# Patient Record
Sex: Female | Born: 1970 | Race: Black or African American | Hispanic: No | Smoking: Never smoker
Health system: Southern US, Community
[De-identification: ages and names within clinical notes are randomized; demographics above are authoritative.]

## PROBLEM LIST (undated history)

## (undated) DIAGNOSIS — R51 Headache: Secondary | ICD-10-CM

## (undated) DIAGNOSIS — R519 Headache, unspecified: Secondary | ICD-10-CM

---

## 1989-12-03 HISTORY — PX: KNEE ARTHROSCOPY: SHX127

## 2000-03-27 ENCOUNTER — Other Ambulatory Visit: Admission: RE | Admit: 2000-03-27 | Discharge: 2000-03-27 | Payer: Self-pay | Admitting: Obstetrics & Gynecology

## 2002-06-17 ENCOUNTER — Other Ambulatory Visit: Admission: RE | Admit: 2002-06-17 | Discharge: 2002-06-17 | Payer: Self-pay | Admitting: Obstetrics & Gynecology

## 2003-06-26 ENCOUNTER — Other Ambulatory Visit: Admission: RE | Admit: 2003-06-26 | Discharge: 2003-06-26 | Payer: Self-pay | Admitting: Obstetrics & Gynecology

## 2004-06-28 ENCOUNTER — Other Ambulatory Visit: Admission: RE | Admit: 2004-06-28 | Discharge: 2004-06-28 | Payer: Self-pay | Admitting: Obstetrics & Gynecology

## 2006-01-17 ENCOUNTER — Emergency Department (HOSPITAL_COMMUNITY): Admission: EM | Admit: 2006-01-17 | Discharge: 2006-01-17 | Payer: Self-pay | Admitting: Emergency Medicine

## 2010-04-27 ENCOUNTER — Other Ambulatory Visit: Payer: Self-pay | Admitting: Obstetrics & Gynecology

## 2010-04-27 ENCOUNTER — Inpatient Hospital Stay (HOSPITAL_COMMUNITY)
Admission: AD | Admit: 2010-04-27 | Discharge: 2010-04-30 | DRG: 765 | Disposition: A | Discharge status: Home / Self Care | Payer: Managed Care, Other (non HMO) | Source: Ambulatory Visit | Attending: Obstetrics & Gynecology | Admitting: Obstetrics & Gynecology

## 2010-04-27 DIAGNOSIS — Z302 Encounter for sterilization: Secondary | ICD-10-CM

## 2010-04-27 DIAGNOSIS — O34219 Maternal care for unspecified type scar from previous cesarean delivery: Principal | ICD-10-CM | POA: Diagnosis present

## 2010-04-27 DIAGNOSIS — O09529 Supervision of elderly multigravida, unspecified trimester: Secondary | ICD-10-CM | POA: Diagnosis present

## 2010-04-27 LAB — CBC
HCT: 35.9 % — ABNORMAL LOW (ref 36.0–46.0)
Hemoglobin: 12.3 g/dL (ref 12.0–15.0)
MCHC: 34.3 g/dL (ref 30.0–36.0)
RBC: 3.97 MIL/uL (ref 3.87–5.11)
WBC: 8.1 10*3/uL (ref 4.0–10.5)

## 2010-04-27 LAB — RPR: RPR Ser Ql: NONREACTIVE

## 2010-04-27 NOTE — Op Note (Signed)
NAME:  Mikayla Wiley, Mikayla Wiley               ACCOUNT NO.:  [id number]  MEDICAL RECORD NO.:  [id number]           PATIENT TYPE:  I  LOCATION:  9119                          FACILITY:  [facility]  PHYSICIAN:  Gerrit Friends. Aldona Bar, M.D.   DATE OF BIRTH:  [birth year]  DATE OF PROCEDURE:  04/26/2010 DATE OF DISCHARGE:                              OPERATIVE REPORT   PREOPERATIVE DIAGNOSES: 1. 34-week intrauterine pregnancy. 2. Previous cesarean section. 3. Desire for permanent elective sterilization. 4. Active labor. 5. Nonreassuring fetal tracing. 6. Unknown GBS Status  POSTOPERATIVE DIAGNOSES: 1. 34-week intrauterine pregnancy. 2. Previous cesarean section. 3. Desire for permanent elective sterilization. 4. Active labor. 5. Nonreassuring fetal tracing. 6. Unknown GBS Status 7. Delivery of 1964 grams female infant, Apgars 2 and 8, arterial cord     pH 7.25, and pathology pending on segments of each fallopian tube.  PROCEDURES:  Repeat low transverse cesarean section and tubal sterilization procedure.  SURGEON:  Gerrit Friends. Aldona Bar, MD  ANESTHESIA:  Attempted spinal, general endotracheal anesthesia - Dr. Malen Gauze.  HISTORY:  This 40 year old gravida 3, para 1 (previous cesarean section) presented to Triage having been brought in by the Rescue Squad in active labor at 34 weeks' gestation.  The patient clocked in at [facility] at 12:51 a.m.  I was called at 1:10 a.m. and told of the patient's arrival and that not only was she a repeat cesarean section in labor but there was a nonreassuring fetal tracing and that the cervical exam found the patient be fully dilated with the vertex at +1 station. I arrived at the hospital in less than 10 minutes and assessed the patient.  At this time, the fetal heart appeared to be somewhat reassuring.  A vaginal exam found the patient to be 4 cm dilated about 90% effaced with vertex at about zero to +1 station.  Status of membranes was unknown - no membranes were  felt.  In discussion with the patient, I confirmed her desire for a repeat cesarean section and indeed this was my choice because of the nonreassuring fetal heart pattern that was documented prior to my arrival and the fact that the patient was only 4 cm dilated by my exam. The patient also had this discussed with me earlier in the office a permanent elective sterilization procedure.  Accordingly, the permit was signed by the patient for a repeat cesarean section and a tubal sterilization procedure but because of all the subsequent excitement, I did not signed the permit itself until after the procedure was complete. However, I had talked to the patient about this in the MAU area.  DESCRIPTION OF PROCEDURE:  The patient was wheeled to the OR which was ready and was taken into the operating room.  Because of the somewhat reassuring fetal heart tracing, a spinal anesthetic was carried out by Dr. Malen Gauze and once the patient thereafter was prepped and draped with a Foley catheter placed, the spinal was found to be not working - there was essentially no anesthetic level and therefore general endotracheal anesthesia was carried out.  Once the patient was anesthetized, procedure was begun.  A  Pfannenstiel incision was made and with minimal difficulty dissected down sharply to and through the fascia in a low transverse fashion with hemostasis created at each layer as best as possible.  Subfascial space was created inferiorly and superiorly, muscles separated in midline, peritoneum identified and entered appropriately with care taken to avoid the bowel superiorly and the bladder inferiorly.  There were some significant adhesions from the anterior abdominal wall to the anterior surface of the uterus and these were sharply taken down.  At this time, the vesicouterine peritoneum was incised in a low transverse fashion.  Ultimately, the cavity was entered and excision extended laterally.  The  baby was in vertex and head was deeply fixed in the pelvis.  An attempt was made to elevate the head out of the pelvis, but it was felt to be ultimately easier to extend the incision laterally giving the more room and to go to the fundus of the uterus and deliver the baby as a breech with the head gently lifted out of the pelvis after delivery of the baby's body.  This was carried out and went very well.  Infant was delivered essentially as a breech and once the cord was clamped and cut, the infant was passed off to the awaiting team.  Infant's Apgars were 2 and 8, and arterial cord pH which was sent was 7.25.  There was a very tight nuchal cord noted.  At this time, the placenta was delivered intact after appropriate cord bloods were collected and the placenta was sent to pathology appropriately labeled.  At this time, the uterus was delivered.  As previously mentioned, there was an extension of the uterine incision laterally, the uterine incision was closed using #1 Vicryl in a running locking fashion and this was oversewn with #1 Vicryl running in a fashion and this was oversewn with several figure-of-eight #1 Vicryls with ultimate adequate hemostasis achieved.  At this time, the uterus was noted to be fairly well contracted and bleeding was controlled in the uterine incision.  Attention was turned to each fallopian tube.  A classic Pomeroy tubal sterilization procedure was carried out - a knuckle tube was elevated and a free tie of #1 plain catgut suture tied down about the knuckle.  The knuckle was excised and sent to pathology and this was carried out on both the right and left fallopian tubes.  Specimens were sent appropriately labeled.  In the process, on the left fallopian tube, some adhesions were freed up to enhance mobility of the tube.  At this time, again the uterine incision was noted to be relatively dry. The abdomen lavaged of all free blood and clot and at this point  all counts were noted to be correct and no foreign bodies were noted to be remaining in the abdominal cavity.  The uterus was replaced into the abdominal cavity.  Again, the uterine incision was relatively dry and the tubal sterilization sites were dry as well.  At this time, closure of the abdomen was begun in layers.  The abdominal peritoneum was closed with 0-Vicryl in a running fashion with care taken to avoid bowel and bladder.  The muscles were then closed as well with this continuous 0- Vicryl suture.  Subfascial space was noted to be relatively dry.  At this time, the fascia was closed using 0-Vicryl from angle to midline bilaterally.  Subcutaneous tissue was noted to be hemostatic and staples were then used to close the skin.  A sterile pressure dressing was  applied and the patient was transported to recovery in satisfactory condition having tolerated the procedure well.  Estimated blood loss 600 mL.  All counts correct x2.  The baby was seen in the Newborn Intensive Care Unit and was stable. The baby was on no oxygen and appeared to have tolerated the procedure well.  Likewise, upon mother's arrival in recovery room, she was stable as well, recovering from both surgery and general anesthetic and failed spinal anesthetic.  In summary, this patient, a 40 year old gravida 3, para 1 who had a previous cesarean section presented to Maternity Admissions in active labor with a nonreassuring tracing.  She was taken to the operating room for a repeat low transverse cesarean section at 34 weeks and was delivered of this 1964 grams female infant with Apgars of 2 and 8. At conclusion of the procedure, both mother and baby were doing well in their respective recovery areas.  As mentioned, all counts were correct x2 and pathologic specimen consisted of the segment of each fallopian tube and placenta.     Gerrit Friends. Aldona Bar, M.D.     RMW/MEDQ  D:  04/27/2010  T:  04/27/2010  Job:   [id number]  Electronically Signed by Annamaria Helling M.D. on 04/27/2010 07:14:06 AM

## 2010-04-28 LAB — CBC
HCT: 28.8 % — ABNORMAL LOW (ref 36.0–46.0)
Hemoglobin: 9.7 g/dL — ABNORMAL LOW (ref 12.0–15.0)
MCV: 91.7 fL (ref 78.0–100.0)
RBC: 3.14 MIL/uL — ABNORMAL LOW (ref 3.87–5.11)
WBC: 16.6 10*3/uL — ABNORMAL HIGH (ref 4.0–10.5)

## 2010-06-03 NOTE — Discharge Summary (Signed)
  NAME:  DESAREE, DOWNEN               ACCOUNT NO.:  [id number]  MEDICAL RECORD NO.:  [id number]           PATIENT TYPE:  I  LOCATION:  9119                          [facility]  [facility]  PHYSICIAN:  Ari Bernabei H. Tenny Craw, MD     DATE OF BIRTH:  1970-10-27  DATE OF ADMISSION:  04/27/2010 DATE OF DISCHARGE:  04/30/2010                              DISCHARGE SUMMARY   DISCHARGING PHYSICIAN:  Enrique Sack H. Tenny Craw, MD  HOSPITAL PROCEDURES: 1. Repeat low transverse cesarean section. 2. Bilateral tubal ligation.  HOSPITAL COURSE:  Ms. Laural Benes is a 40 year old gravida 3, para 1-0-1-1, who presented in the early morning of April 26, 2010, in active labor by squad.  The patient was 34 weeks' gestation.  There was nonreassuring fetal well-being noted on electronic fetal monitoring.  The decision was made to proceed directly to the operating room for an emergent repeat low transverse cesarean section.  The patient also desired permanent sterilization.  She underwent a repeat low transverse cesarean section by Dr. Annamaria Helling for delivery of a female infant, weighing 1964 g with Apgar scores of 2 at 1 minute and 8 at 5 minutes and an arterial cord pH of 7.25.  Additionally during this procedure, she underwent a bilateral tubal ligation.  Postoperatively, she did well and by postoperative day #4, she was deemed ready for discharge home.  She had excellent pain control with oral pain medication.  She was instructed of any signs or symptoms to be aware for need to return to the hospital.  Staples were removed prior to discharge.  At this time, the baby remained in the NICU, doing well.  DISCHARGE LABS:  White blood cell count 16.7, hemoglobin 9.7, hematocrit 28.8, platelets 120.     Freddrick March. Tenny Craw, MD     KHR/MEDQ  D:  04/30/2010  T:  04/30/2010  Job:  [id number]  Electronically Signed by Waynard Reeds MD on 06/03/2010 09:04:42 AM

## 2011-05-20 ENCOUNTER — Other Ambulatory Visit: Payer: Self-pay | Admitting: Family Medicine

## 2011-05-20 DIAGNOSIS — IMO0002 Reserved for concepts with insufficient information to code with codable children: Secondary | ICD-10-CM

## 2011-05-25 ENCOUNTER — Ambulatory Visit
Admission: RE | Admit: 2011-05-25 | Discharge: 2011-05-25 | Disposition: A | Discharge status: Home / Self Care | Payer: BC Managed Care – PPO | Source: Ambulatory Visit | Attending: Family Medicine | Admitting: Family Medicine

## 2011-05-25 DIAGNOSIS — IMO0002 Reserved for concepts with insufficient information to code with codable children: Secondary | ICD-10-CM

## 2012-08-25 ENCOUNTER — Encounter (HOSPITAL_COMMUNITY): Payer: Self-pay | Admitting: Emergency Medicine

## 2012-08-25 ENCOUNTER — Emergency Department (INDEPENDENT_AMBULATORY_CARE_PROVIDER_SITE_OTHER)
Admission: EM | Admit: 2012-08-25 | Discharge: 2012-08-25 | Disposition: A | Discharge status: Home / Self Care | Payer: BC Managed Care – PPO | Source: Home / Self Care

## 2012-08-25 DIAGNOSIS — M5412 Radiculopathy, cervical region: Secondary | ICD-10-CM

## 2012-08-25 MED ORDER — AMITRIPTYLINE HCL 25 MG PO TABS: 25.0000 mg | ORAL_TABLET | Freq: Every evening | ORAL | Status: DC | PRN

## 2012-08-25 NOTE — ED Provider Notes (Signed)
Mikayla Wiley is a 42 y.o. female who presents to Urgent Care today for right arm numbness tingling and weakness. Patient has had intermittent but worsening right arm tingling numbness associated with some weakness. The distribution is predominantly from her lateral elbow into the first 3 digits of her right hand. This is associated with some weakness as well. She notes that it is slightly worsening. She denies any injury. Additionally she notes right lateral hip pain with radiating pain to her lateral knee. This is painful to lay on that side and worse with activity. She's tried some over-the-counter pain medications which have helped some. She denies any injury nausea vomiting diarrhea fevers or chills.    PMH reviewed. History of cervical DDD C5 and C6 History  Substance Use Topics  . Smoking status: Never Smoker   . Smokeless tobacco: Not on file  . Alcohol Use: No   ROS as above Medications reviewed. No current facility-administered medications for this encounter.   Current Outpatient Prescriptions  Medication Sig Dispense Refill  . amitriptyline (ELAVIL) 25 MG tablet Take 1 tablet (25 mg total) by mouth at bedtime as needed for pain.  30 tablet  0    Exam:  Pulse 66  Temp(Src) 97.9 F (36.6 C) (Oral)  Resp 17  SpO2 100%  LMP 08/07/2012 Gen: Well NAD HEENT: EOMI,  MMM Exts: Non edematous BL  LE, warm and well perfused.  MSK: Neck: Nontender spinal midline. Decreased range of motion the lateral flexion. Mildly positive Spurling's test.  Upper extremity grip strength: Diminished on right compared to left.  Positive Phalen's test right compared to left.  Negative Tinel's test.  Hip: Tender to palpation over right greater trochanter. Normal range of motion.  Normal gait Capillary refill and sensation are intact bilateral upper and lower extremity  No results found for this or any previous visit (from the past 24 hour(s)). No results found.  Assessment and Plan: 42 y.o.  female with  1) paresthesias and loss of grip strength in the right upper extremity. Most likely cause of cervical radiculopathy as patient has an MRI from 2013 showing DDD at C5 and C6. Alternative diagnoses include carpal tunnel syndrome.  Patient has bothersome paresthesias. Will use amitriptyline at night for symptomatic relief.  Additional followup with Dr. Farris Has her Dr. Katrinka Blazing in several days. Recommend repeat MRI scan at that time plus/minus nerve conduction studies.  2) additionally patient has greater trochanteric bursitis of the right.  Followup the sports medicine as above. Consider injection therapy bedtime as needed.    Discussed warning signs or symptoms. Please see discharge instructions. Patient expresses understanding.      Rodolph Bong, MD 08/25/12 1019

## 2012-08-25 NOTE — ED Notes (Signed)
Pt c/o intermittent numbness on right side... Right arm and right leg Last past week, has been more frequently Denies: weakness, CP, SOB, blurry vision Alert w/no signs of acute distress.

## 2013-09-19 ENCOUNTER — Other Ambulatory Visit (HOSPITAL_COMMUNITY)
Admission: RE | Admit: 2013-09-19 | Discharge: 2013-09-19 | Disposition: A | Discharge status: Home / Self Care | Payer: BC Managed Care – PPO | Source: Ambulatory Visit | Attending: Physician Assistant | Admitting: Physician Assistant

## 2013-09-19 ENCOUNTER — Other Ambulatory Visit: Payer: Self-pay | Admitting: Physician Assistant

## 2013-09-19 DIAGNOSIS — Z1151 Encounter for screening for human papillomavirus (HPV): Secondary | ICD-10-CM | POA: Diagnosis present

## 2013-09-19 DIAGNOSIS — Z124 Encounter for screening for malignant neoplasm of cervix: Secondary | ICD-10-CM | POA: Diagnosis not present

## 2013-09-20 ENCOUNTER — Other Ambulatory Visit: Payer: Self-pay

## 2013-09-20 DIAGNOSIS — Z1231 Encounter for screening mammogram for malignant neoplasm of breast: Secondary | ICD-10-CM

## 2013-09-23 ENCOUNTER — Ambulatory Visit: Payer: BC Managed Care – PPO

## 2013-09-24 ENCOUNTER — Other Ambulatory Visit: Payer: Self-pay | Admitting: Orthopedic Surgery

## 2013-09-24 DIAGNOSIS — M542 Cervicalgia: Secondary | ICD-10-CM

## 2013-09-24 LAB — CYTOLOGY - PAP

## 2013-09-26 ENCOUNTER — Ambulatory Visit
Admission: RE | Admit: 2013-09-26 | Discharge: 2013-09-26 | Disposition: A | Discharge status: Home / Self Care | Payer: BC Managed Care – PPO | Source: Ambulatory Visit

## 2013-09-26 ENCOUNTER — Encounter (INDEPENDENT_AMBULATORY_CARE_PROVIDER_SITE_OTHER): Payer: Self-pay

## 2013-09-26 DIAGNOSIS — Z1231 Encounter for screening mammogram for malignant neoplasm of breast: Secondary | ICD-10-CM

## 2013-10-03 ENCOUNTER — Ambulatory Visit
Admission: RE | Admit: 2013-10-03 | Discharge: 2013-10-03 | Disposition: A | Discharge status: Home / Self Care | Payer: BC Managed Care – PPO | Source: Ambulatory Visit | Attending: Orthopedic Surgery | Admitting: Orthopedic Surgery

## 2013-10-03 DIAGNOSIS — M542 Cervicalgia: Secondary | ICD-10-CM

## 2013-10-04 ENCOUNTER — Other Ambulatory Visit: Payer: BC Managed Care – PPO

## 2013-12-17 ENCOUNTER — Other Ambulatory Visit (HOSPITAL_COMMUNITY): Payer: Self-pay | Admitting: Orthopaedic Surgery

## 2013-12-31 ENCOUNTER — Other Ambulatory Visit (HOSPITAL_COMMUNITY): Payer: Self-pay | Admitting: Orthopaedic Surgery

## 2014-01-20 ENCOUNTER — Encounter (HOSPITAL_COMMUNITY): Payer: Self-pay | Admitting: Pharmacy Technician

## 2014-01-21 ENCOUNTER — Encounter (HOSPITAL_COMMUNITY): Payer: Self-pay

## 2014-01-21 ENCOUNTER — Ambulatory Visit (HOSPITAL_COMMUNITY)
Admission: RE | Admit: 2014-01-21 | Discharge: 2014-01-21 | Disposition: A | Discharge status: Home / Self Care | Payer: BLUE CROSS/BLUE SHIELD | Source: Ambulatory Visit | Attending: Orthopaedic Surgery | Admitting: Orthopaedic Surgery

## 2014-01-21 ENCOUNTER — Encounter (HOSPITAL_COMMUNITY)
Admission: RE | Admit: 2014-01-21 | Discharge: 2014-01-21 | Disposition: A | Discharge status: Home / Self Care | Payer: BLUE CROSS/BLUE SHIELD | Source: Ambulatory Visit | Attending: Orthopaedic Surgery | Admitting: Orthopaedic Surgery

## 2014-01-21 DIAGNOSIS — R51 Headache: Secondary | ICD-10-CM | POA: Insufficient documentation

## 2014-01-21 DIAGNOSIS — Z01818 Encounter for other preprocedural examination: Secondary | ICD-10-CM | POA: Insufficient documentation

## 2014-01-21 DIAGNOSIS — M4722 Other spondylosis with radiculopathy, cervical region: Secondary | ICD-10-CM

## 2014-01-21 HISTORY — DX: Headache: R51

## 2014-01-21 HISTORY — DX: Headache, unspecified: R51.9

## 2014-01-21 LAB — URINALYSIS, ROUTINE W REFLEX MICROSCOPIC
Bilirubin Urine: NEGATIVE
Glucose, UA: NEGATIVE mg/dL
HGB URINE DIPSTICK: NEGATIVE
KETONES UR: NEGATIVE mg/dL
NITRITE: NEGATIVE
PH: 5 (ref 5.0–8.0)
Protein, ur: NEGATIVE mg/dL
SPECIFIC GRAVITY, URINE: 1.026 (ref 1.005–1.030)
Urobilinogen, UA: 0.2 mg/dL (ref 0.0–1.0)

## 2014-01-21 LAB — CBC
HEMATOCRIT: 39 % (ref 36.0–46.0)
HEMOGLOBIN: 13.3 g/dL (ref 12.0–15.0)
MCH: 30.8 pg (ref 26.0–34.0)
MCHC: 34.1 g/dL (ref 30.0–36.0)
MCV: 90.3 fL (ref 78.0–100.0)
Platelets: 203 10*3/uL (ref 150–400)
RBC: 4.32 MIL/uL (ref 3.87–5.11)
RDW: 12.7 % (ref 11.5–15.5)
WBC: 4.8 10*3/uL (ref 4.0–10.5)

## 2014-01-21 LAB — PROTIME-INR
INR: 1.13 (ref 0.00–1.49)
Prothrombin Time: 14.6 seconds (ref 11.6–15.2)

## 2014-01-21 LAB — COMPREHENSIVE METABOLIC PANEL
ALBUMIN: 3.8 g/dL (ref 3.5–5.2)
ALT: 9 U/L (ref 0–35)
ANION GAP: 3 — AB (ref 5–15)
AST: 18 U/L (ref 0–37)
Alkaline Phosphatase: 55 U/L (ref 39–117)
BUN: 11 mg/dL (ref 6–23)
CO2: 29 mmol/L (ref 19–32)
CREATININE: 1.17 mg/dL — AB (ref 0.50–1.10)
Calcium: 9.3 mg/dL (ref 8.4–10.5)
Chloride: 109 mEq/L (ref 96–112)
GFR calc Af Amer: 64 mL/min — ABNORMAL LOW (ref >90)
GFR calc non Af Amer: 55 mL/min — ABNORMAL LOW (ref >90)
GLUCOSE: 88 mg/dL (ref 70–99)
Potassium: 4.3 mmol/L (ref 3.5–5.1)
SODIUM: 141 mmol/L (ref 135–145)
Total Bilirubin: 1.1 mg/dL (ref 0.3–1.2)
Total Protein: 7 g/dL (ref 6.0–8.3)

## 2014-01-21 LAB — URINE MICROSCOPIC-ADD ON

## 2014-01-21 LAB — SURGICAL PCR SCREEN
MRSA, PCR: NEGATIVE
Staphylococcus aureus: NEGATIVE

## 2014-01-21 LAB — HCG, SERUM, QUALITATIVE: Preg, Serum: NEGATIVE

## 2014-01-21 NOTE — Pre-Procedure Instructions (Signed)
Jeris Pentanita W Johnson  01/21/2014   Your procedure is scheduled on: Friday, January 31, 2014  [facility] at 5:30 AM.  Call this number if you have problems the morning of surgery: [phone]   Remember:   Do not eat food or drink liquids after midnight Thursday, January 30, 2014   Take these medicines the morning of surgery with A SIP OF WATER: None  Stop taking Aspirin, vitamins, and herbal medications. Do not take any NSAIDs ie: Ibuprofen, Advil, Naproxen or any medication containing Aspirin; stop 1 week prior to procedure ( Friday, January 24, 2014)  Do not wear jewelry, make-up or nail polish.  Do not wear lotions, powders, or perfumes. You may not wear deodorant.  Do not shave 48 hours prior to surgery.   Do not bring valuables to the hospital.  [facility] is not responsible for any belongings or valuables.               Contacts, dentures or bridgework may not be worn into surgery.  Leave suitcase in the car. After surgery it may be brought to your room.  For patients admitted to the hospital, discharge time is determined by your treatment team.               Patients discharged the day of surgery will not be allowed to drive home.  Name and phone number of your driver:   Special Instructions:  Special Instructions:Special Instructions: [facility] - Preparing for Surgery  Before surgery, you can play an important role.  Because skin is not sterile, your skin needs to be as free of germs as possible.  You can reduce the number of germs on you skin by washing with CHG (chlorahexidine gluconate) soap before surgery.  CHG is an antiseptic cleaner which kills germs and bonds with the skin to continue killing germs even after washing.  Please DO NOT use if you have an allergy to CHG or antibacterial soaps.  If your skin becomes reddened/irritated stop using the CHG and inform your nurse when you arrive at Short Stay.  Do not shave (including legs and  underarms) for at least 48 hours prior to the first CHG shower.  You may shave your face.  Please follow these instructions carefully:   1.  Shower with CHG Soap the night before surgery and the morning of Surgery.  2.  If you choose to wash your hair, wash your hair first as usual with your normal shampoo.  3.  After you shampoo, rinse your hair and body thoroughly to remove the Shampoo.  4.  Use CHG as you would any other liquid soap.  You can apply chg directly  to the skin and wash gently with scrungie or a clean washcloth.  5.  Apply the CHG Soap to your body ONLY FROM THE NECK DOWN.  Do not use on open wounds or open sores.  Avoid contact with your eyes, ears, mouth and genitals (private parts).  Wash genitals (private parts) with your normal soap.  6.  Wash thoroughly, paying special attention to the area where your surgery will be performed.  7.  Thoroughly rinse your body with warm water from the neck down.  8.  DO NOT shower/wash with your normal soap after using and rinsing off the CHG Soap.  9.  Pat yourself dry with a clean towel.            10.  Wear clean pajamas.  11.  Place clean sheets on your bed the night of your first shower and do not sleep with pets.  Day of Surgery  Do not apply any lotions/deodorants the morning of surgery.  Please wear clean clothes to the hospital/surgery center.   Please read over the following fact sheets that you were given: Pain Booklet, Coughing and Deep Breathing, MRSA Information and Surgical Site Infection Prevention

## 2014-01-21 NOTE — Progress Notes (Signed)
PCP: [facility]: Dr. Ihor DowNnodi or Dr. Leary RocaMahoney  Denies any cardiac studies.

## 2014-01-28 NOTE — H&P (Signed)
  [facility]   A Division of [facility]   [address], [place], [place] [address] Telephone: [phone]  Fax: [phone]     PATIENT: Mikayla Wiley, Mikayla Wiley   MR#: [id number]  DOB: 07/16/1970   Visit Date: 10/22/2013     Patient is seen for cervical spondylosis with left radiculopathy from C5-6 soft and hard osteophyte complex with compression.  She has been treated with physical therapy, muscle relaxants, Medrol Dosepak without relief.  She has had symptoms dating back to 2014 with progressive symptoms over the last 1-1/2 years.  MRI scan dating back 2013 showed cervical disk at C5-6 with left-sided compression.  She has been on Elavil to help at night with sleeping.  Followup MRI scan performed 10/03/2013 showed worsening and progression of the left C5-6 left-side disk herniation with further progression and effacement of the ventral CSF with moderate left foraminal stenosis and mild right foraminal stenosis.  Minimal bulge at C4-5, other levels were normal.  She has used anti-inflammatories, Tylenol, Medrol Dosepak, muscle relaxants, amitriptyline, without relief.     [facility]   PAST MEDICAL/SURGICAL HISTORY:  Previous surgeries include arthroscopic surgery in 1991, C-section 1999 and 2012.   SOCIAL HISTORY:  Patient is married to her husband, Helvin.  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  She does not smoke.  Occasionally drinks socially.     FAMILY HISTORY:  Positive for diabetes, hypertension.   REVIEW OF SYSTEMS:  A 14-point is positive for migraines.     PHYSICAL EXAMINATION:  Patient is alert and oriented, 5 feet 7 inches, 150 pounds, extraocular movement is intact.  Normal smile and grimace.  She has brachial plexus tenderness on the left, positive Spurling on the left, minimal tenderness on the right, negative Spurling on the right, negative Lhermitte.  Left radial side had numbness.   Normal sensation on the right.  Isolated motor testing is intact.  Reflexes are 2+ and symmetrical.  She has severe pain with cervical compression, minimal relief with distraction.     ASSESSMENT:  Progressive spondylosis, disk progression left C5-6 with radiculopathy and failure of conservative treatment over 2-years.   PLAN:  We discussed options.  She would like to proceed with cervical intervention.  We discussed operative technique, overnight stay, use of the operative microscope, incision, risks of surgery including dysphonia, dysphasia, pseudarthrosis, reoperation, spinal cord problems, recurrent laryngeal nerve problems, and need for posterior cervical fusion if anterior procedure did not heal.  Card is given, she would like to schedule this after the holidays due to her job.      For additional information please see handwritten notes, reports, orders and prescriptions in this chart.      Mark C. Ophelia CharterYates, M.D.    Auto-Authenticated by Veverly FellsMark C. Ophelia CharterYates, M.D.  MCY/lp/af DD: 10/22/2013  DT: 10/23/2013   cc: Duane LopeAlan Ross, M.D.([employer])

## 2014-01-30 MED ORDER — CEFAZOLIN SODIUM-DEXTROSE 2-3 GM-% IV SOLR
2.0000 g | INTRAVENOUS | Status: AC
  Administered 2014-01-31: 2 g via INTRAVENOUS
  Filled 2014-01-30: qty 50

## 2014-01-31 ENCOUNTER — Ambulatory Visit (HOSPITAL_COMMUNITY): Payer: BLUE CROSS/BLUE SHIELD

## 2014-01-31 ENCOUNTER — Ambulatory Visit (HOSPITAL_COMMUNITY): Payer: BLUE CROSS/BLUE SHIELD | Admitting: Anesthesiology

## 2014-01-31 ENCOUNTER — Encounter (HOSPITAL_COMMUNITY)
Admission: RE | Disposition: A | Discharge status: Home / Self Care | Payer: Self-pay | Source: Ambulatory Visit | Attending: Orthopaedic Surgery

## 2014-01-31 ENCOUNTER — Encounter (HOSPITAL_COMMUNITY): Payer: Self-pay | Admitting: *Deleted

## 2014-01-31 ENCOUNTER — Observation Stay (HOSPITAL_COMMUNITY)
Admission: RE | Admit: 2014-01-31 | Discharge: 2014-02-01 | Disposition: A | Discharge status: Home / Self Care | Payer: BLUE CROSS/BLUE SHIELD | Source: Ambulatory Visit | Attending: Orthopaedic Surgery | Admitting: Orthopaedic Surgery

## 2014-01-31 DIAGNOSIS — M4722 Other spondylosis with radiculopathy, cervical region: Principal | ICD-10-CM | POA: Insufficient documentation

## 2014-01-31 DIAGNOSIS — Z981 Arthrodesis status: Secondary | ICD-10-CM

## 2014-01-31 DIAGNOSIS — Z419 Encounter for procedure for purposes other than remedying health state, unspecified: Secondary | ICD-10-CM

## 2014-01-31 HISTORY — PX: ANTERIOR CERVICAL DECOMP/DISCECTOMY FUSION: SHX1161

## 2014-01-31 SURGERY — ANTERIOR CERVICAL DECOMPRESSION/DISCECTOMY FUSION 1 LEVEL
Anesthesia: General

## 2014-01-31 MED ORDER — CHLORHEXIDINE GLUCONATE 4 % EX LIQD
60.0000 mL | Freq: Once | CUTANEOUS | Status: DC
  Filled 2014-01-31: qty 60

## 2014-01-31 MED ORDER — METOCLOPRAMIDE HCL 5 MG/ML IJ SOLN: 5.0000 mg | Freq: Three times a day (TID) | INTRAMUSCULAR | Status: DC | PRN

## 2014-01-31 MED ORDER — HYDROMORPHONE HCL 1 MG/ML IJ SOLN
0.2500 mg | INTRAMUSCULAR | Status: DC | PRN
  Administered 2014-01-31 (×2): 0.5 mg via INTRAVENOUS

## 2014-01-31 MED ORDER — NEOSTIGMINE METHYLSULFATE 10 MG/10ML IV SOLN
INTRAVENOUS | Status: DC | PRN
  Administered 2014-01-31: 4 mg via INTRAVENOUS

## 2014-01-31 MED ORDER — METHOCARBAMOL 500 MG PO TABS
500.0000 mg | ORAL_TABLET | Freq: Four times a day (QID) | ORAL | Status: DC | PRN
  Administered 2014-01-31 – 2014-02-01 (×3): 500 mg via ORAL
  Filled 2014-01-31 (×3): qty 1

## 2014-01-31 MED ORDER — ONDANSETRON HCL 4 MG/2ML IJ SOLN
4.0000 mg | Freq: Four times a day (QID) | INTRAMUSCULAR | Status: DC | PRN
  Administered 2014-01-31 – 2014-02-01 (×2): 4 mg via INTRAVENOUS
  Filled 2014-01-31 (×2): qty 2

## 2014-01-31 MED ORDER — PHENYLEPHRINE 40 MCG/ML (10ML) SYRINGE FOR IV PUSH (FOR BLOOD PRESSURE SUPPORT)
PREFILLED_SYRINGE | INTRAVENOUS | Status: AC
  Filled 2014-01-31: qty 10

## 2014-01-31 MED ORDER — DEXAMETHASONE SODIUM PHOSPHATE 10 MG/ML IJ SOLN
INTRAMUSCULAR | Status: DC | PRN
  Administered 2014-01-31: 10 mg via INTRAVENOUS

## 2014-01-31 MED ORDER — SUCCINYLCHOLINE CHLORIDE 20 MG/ML IJ SOLN
INTRAMUSCULAR | Status: AC
  Filled 2014-01-31: qty 1

## 2014-01-31 MED ORDER — PROPOFOL 10 MG/ML IV BOLUS
INTRAVENOUS | Status: DC | PRN
  Administered 2014-01-31: 150 mg via INTRAVENOUS

## 2014-01-31 MED ORDER — ROCURONIUM BROMIDE 100 MG/10ML IV SOLN
INTRAVENOUS | Status: DC | PRN
  Administered 2014-01-31: 30 mg via INTRAVENOUS

## 2014-01-31 MED ORDER — HYDROMORPHONE HCL 1 MG/ML IJ SOLN
0.5000 mg | INTRAMUSCULAR | Status: DC | PRN
  Administered 2014-01-31: 1 mg via INTRAVENOUS
  Filled 2014-01-31 (×2): qty 1

## 2014-01-31 MED ORDER — SODIUM CHLORIDE 0.9 % IJ SOLN
INTRAMUSCULAR | Status: AC
  Filled 2014-01-31: qty 10

## 2014-01-31 MED ORDER — ONDANSETRON HCL 4 MG PO TABS: 4.0000 mg | ORAL_TABLET | Freq: Four times a day (QID) | ORAL | Status: DC | PRN

## 2014-01-31 MED ORDER — 0.9 % SODIUM CHLORIDE (POUR BTL) OPTIME
TOPICAL | Status: DC | PRN
  Administered 2014-01-31: 1000 mL

## 2014-01-31 MED ORDER — FENTANYL CITRATE 0.05 MG/ML IJ SOLN
INTRAMUSCULAR | Status: DC | PRN
  Administered 2014-01-31: 100 ug via INTRAVENOUS

## 2014-01-31 MED ORDER — ROCURONIUM BROMIDE 50 MG/5ML IV SOLN
INTRAVENOUS | Status: AC
  Filled 2014-01-31: qty 1

## 2014-01-31 MED ORDER — SUCCINYLCHOLINE CHLORIDE 20 MG/ML IJ SOLN
INTRAMUSCULAR | Status: DC | PRN
  Administered 2014-01-31: 120 mg via INTRAVENOUS

## 2014-01-31 MED ORDER — METHOCARBAMOL 1000 MG/10ML IJ SOLN
500.0000 mg | Freq: Four times a day (QID) | INTRAVENOUS | Status: DC | PRN
  Filled 2014-01-31: qty 5

## 2014-01-31 MED ORDER — OXYCODONE-ACETAMINOPHEN 10-325 MG PO TABS: 1.0000 | ORAL_TABLET | Freq: Four times a day (QID) | ORAL | Status: DC | PRN

## 2014-01-31 MED ORDER — PHENYLEPHRINE HCL 10 MG/ML IJ SOLN
10.0000 mg | INTRAVENOUS | Status: DC | PRN
  Administered 2014-01-31: 40 ug/min via INTRAVENOUS

## 2014-01-31 MED ORDER — AMITRIPTYLINE HCL 25 MG PO TABS
25.0000 mg | ORAL_TABLET | Freq: Every evening | ORAL | Status: DC | PRN
  Filled 2014-01-31: qty 1

## 2014-01-31 MED ORDER — PROMETHAZINE HCL 25 MG/ML IJ SOLN: 6.2500 mg | INTRAMUSCULAR | Status: DC | PRN

## 2014-01-31 MED ORDER — ONDANSETRON HCL 4 MG/2ML IJ SOLN
INTRAMUSCULAR | Status: AC
  Filled 2014-01-31: qty 2

## 2014-01-31 MED ORDER — LACTATED RINGERS IV SOLN
INTRAVENOUS | Status: DC | PRN
  Administered 2014-01-31: 07:00:00 via INTRAVENOUS

## 2014-01-31 MED ORDER — LIDOCAINE HCL (CARDIAC) 20 MG/ML IV SOLN
INTRAVENOUS | Status: AC
  Filled 2014-01-31: qty 5

## 2014-01-31 MED ORDER — METOCLOPRAMIDE HCL 10 MG PO TABS
5.0000 mg | ORAL_TABLET | Freq: Three times a day (TID) | ORAL | Status: DC | PRN
  Administered 2014-02-01: 10 mg via ORAL
  Filled 2014-01-31: qty 1

## 2014-01-31 MED ORDER — EPHEDRINE SULFATE 50 MG/ML IJ SOLN
INTRAMUSCULAR | Status: AC
  Filled 2014-01-31: qty 1

## 2014-01-31 MED ORDER — BUPIVACAINE-EPINEPHRINE (PF) 0.5% -1:200000 IJ SOLN
INTRAMUSCULAR | Status: AC
  Filled 2014-01-31: qty 30

## 2014-01-31 MED ORDER — HYDROMORPHONE HCL 1 MG/ML IJ SOLN
INTRAMUSCULAR | Status: AC
  Filled 2014-01-31: qty 1

## 2014-01-31 MED ORDER — FENTANYL CITRATE 0.05 MG/ML IJ SOLN
INTRAMUSCULAR | Status: AC
  Filled 2014-01-31: qty 5

## 2014-01-31 MED ORDER — GLYCOPYRROLATE 0.2 MG/ML IJ SOLN
INTRAMUSCULAR | Status: DC | PRN
  Administered 2014-01-31: 0.6 mg via INTRAVENOUS

## 2014-01-31 MED ORDER — METHOCARBAMOL 500 MG PO TABS: 500.0000 mg | ORAL_TABLET | Freq: Four times a day (QID) | ORAL | Status: DC

## 2014-01-31 MED ORDER — OXYCODONE-ACETAMINOPHEN 5-325 MG PO TABS
1.0000 | ORAL_TABLET | Freq: Four times a day (QID) | ORAL | Status: DC | PRN
  Administered 2014-01-31: 1 via ORAL
  Administered 2014-01-31 – 2014-02-01 (×2): 2 via ORAL
  Filled 2014-01-31: qty 1
  Filled 2014-01-31 (×2): qty 2

## 2014-01-31 MED ORDER — POTASSIUM CHLORIDE IN NACL 20-0.45 MEQ/L-% IV SOLN
INTRAVENOUS | Status: DC
  Administered 2014-01-31: 18:00:00 via INTRAVENOUS
  Filled 2014-01-31 (×3): qty 1000

## 2014-01-31 MED ORDER — LIDOCAINE HCL (CARDIAC) 20 MG/ML IV SOLN
INTRAVENOUS | Status: DC | PRN
  Administered 2014-01-31: 80 mg via INTRAVENOUS

## 2014-01-31 MED ORDER — ONDANSETRON 4 MG PO TBDP: 4.0000 mg | ORAL_TABLET | Freq: Three times a day (TID) | ORAL | Status: DC | PRN

## 2014-01-31 MED ORDER — MIDAZOLAM HCL 5 MG/5ML IJ SOLN
INTRAMUSCULAR | Status: DC | PRN
  Administered 2014-01-31: 2 mg via INTRAVENOUS

## 2014-01-31 MED ORDER — PROPOFOL 10 MG/ML IV BOLUS
INTRAVENOUS | Status: AC
  Filled 2014-01-31: qty 20

## 2014-01-31 MED ORDER — ONDANSETRON HCL 4 MG/2ML IJ SOLN
INTRAMUSCULAR | Status: DC | PRN
  Administered 2014-01-31: 4 mg via INTRAVENOUS

## 2014-01-31 MED ORDER — BUPIVACAINE-EPINEPHRINE 0.5% -1:200000 IJ SOLN
INTRAMUSCULAR | Status: DC | PRN
  Administered 2014-01-31: 20 mL

## 2014-01-31 MED ORDER — MIDAZOLAM HCL 2 MG/2ML IJ SOLN
INTRAMUSCULAR | Status: AC
  Filled 2014-01-31: qty 2

## 2014-01-31 SURGICAL SUPPLY — 59 items
ADH SKN CLS APL DERMABOND .7 (GAUZE/BANDAGES/DRESSINGS) ×1
BIT DRILL SKYLINE 12MM (BIT) ×1 IMPLANT
BLADE SURG ROTATE 9660 (MISCELLANEOUS) IMPLANT
BONE CERV LORDOTIC 14.5X12X6 (Bone Implant) ×3 IMPLANT
BUR ROUND FLUTED 4 SOFT TCH (BURR) ×2 IMPLANT
BUR ROUND FLUTED 4MM SOFT TCH (BURR) ×1
COLLAR CERV LO CONTOUR FIRM DE (SOFTGOODS) ×3 IMPLANT
CORDS BIPOLAR (ELECTRODE) IMPLANT
COVER MAYO STAND STRL (DRAPES) ×3 IMPLANT
COVER SURGICAL LIGHT HANDLE (MISCELLANEOUS) ×3 IMPLANT
CRADLE DONUT ADULT HEAD (MISCELLANEOUS) ×3 IMPLANT
DERMABOND ADVANCED (GAUZE/BANDAGES/DRESSINGS) ×2
DERMABOND ADVANCED .7 DNX12 (GAUZE/BANDAGES/DRESSINGS) ×1 IMPLANT
DRAPE C-ARM 42X72 X-RAY (DRAPES) ×3 IMPLANT
DRAPE MICROSCOPE LEICA (MISCELLANEOUS) ×3 IMPLANT
DRAPE PROXIMA HALF (DRAPES) ×3 IMPLANT
DRILL BIT SKYLINE 12MM (BIT) ×3
DRSG MEPILEX BORDER 4X4 (GAUZE/BANDAGES/DRESSINGS) ×3 IMPLANT
DRSG MEPILEX BORDER 4X8 (GAUZE/BANDAGES/DRESSINGS) ×3 IMPLANT
DURAPREP 6ML APPLICATOR 50/CS (WOUND CARE) ×3 IMPLANT
ELECT COATED BLADE 2.86 ST (ELECTRODE) ×3 IMPLANT
ELECT REM PT RETURN 9FT ADLT (ELECTROSURGICAL) ×3
ELECTRODE REM PT RTRN 9FT ADLT (ELECTROSURGICAL) ×1 IMPLANT
EVACUATOR 1/8 PVC DRAIN (DRAIN) ×3 IMPLANT
GAUZE SPONGE 4X4 12PLY STRL (GAUZE/BANDAGES/DRESSINGS) ×3 IMPLANT
GAUZE XEROFORM 1X8 LF (GAUZE/BANDAGES/DRESSINGS) ×6 IMPLANT
GLOVE BIO SURGEON STRL SZ 6 (GLOVE) ×3 IMPLANT
GLOVE BIOGEL PI IND STRL 6 (GLOVE) ×1 IMPLANT
GLOVE BIOGEL PI IND STRL 8 (GLOVE) ×2 IMPLANT
GLOVE BIOGEL PI INDICATOR 6 (GLOVE) ×2
GLOVE BIOGEL PI INDICATOR 8 (GLOVE) ×4
GLOVE ECLIPSE 7.0 STRL STRAW (GLOVE) ×3 IMPLANT
GLOVE ORTHO TXT STRL SZ7.5 (GLOVE) ×6 IMPLANT
GOWN STRL REUS W/ TWL LRG LVL3 (GOWN DISPOSABLE) ×2 IMPLANT
GOWN STRL REUS W/ TWL XL LVL3 (GOWN DISPOSABLE) ×1 IMPLANT
GOWN STRL REUS W/TWL LRG LVL3 (GOWN DISPOSABLE) ×6
GOWN STRL REUS W/TWL XL LVL3 (GOWN DISPOSABLE) ×3
HEAD HALTER (SOFTGOODS) ×3 IMPLANT
HEMOSTAT SURGICEL 2X14 (HEMOSTASIS) IMPLANT
KIT BASIN OR (CUSTOM PROCEDURE TRAY) ×3 IMPLANT
KIT ROOM TURNOVER OR (KITS) ×3 IMPLANT
NEEDLE 25GX 5/8IN NON SAFETY (NEEDLE) ×3 IMPLANT
NS IRRIG 1000ML POUR BTL (IV SOLUTION) ×3 IMPLANT
PACK ORTHO CERVICAL (CUSTOM PROCEDURE TRAY) ×3 IMPLANT
PAD ARMBOARD 7.5X6 YLW CONV (MISCELLANEOUS) ×6 IMPLANT
PIN TEMP SKYLINE THREADED (PIN) ×3 IMPLANT
PLATE SKYLINE 12MM (Plate) ×3 IMPLANT
SCREW VARIABLE SELF TAP 12MM (Screw) ×12 IMPLANT
SPONGE LAP 18X18 X RAY DECT (DISPOSABLE) ×3 IMPLANT
SURGIFLO TRUKIT (HEMOSTASIS) IMPLANT
SUT VIC AB 2-0 CT1 27 (SUTURE) ×3
SUT VIC AB 2-0 CT1 TAPERPNT 27 (SUTURE) ×1 IMPLANT
SUT VIC AB 3-0 X1 27 (SUTURE) ×3 IMPLANT
SUT VICRYL 4-0 PS2 18IN ABS (SUTURE) ×9 IMPLANT
SYR BULB 3OZ (MISCELLANEOUS) ×3 IMPLANT
SYR CONTROL 10ML LL (SYRINGE) ×3 IMPLANT
TOWEL OR 17X24 6PK STRL BLUE (TOWEL DISPOSABLE) ×3 IMPLANT
TOWEL OR 17X26 10 PK STRL BLUE (TOWEL DISPOSABLE) ×3 IMPLANT
WATER STERILE IRR 1000ML POUR (IV SOLUTION) ×3 IMPLANT

## 2014-01-31 NOTE — Anesthesia Preprocedure Evaluation (Addendum)
Anesthesia Evaluation  Patient identified by MRN, date of birth, ID band Patient awake    Reviewed: Allergy & Precautions, NPO status   History of Anesthesia Complications Negative for: history of anesthetic complications  Airway Mallampati: II   Neck ROM: Full  Mouth opening: Limited Mouth Opening  Dental  (+) Teeth Intact, Dental Advisory Given   Pulmonary neg pulmonary ROS,  breath sounds clear to auscultation        Cardiovascular negative cardio ROS  Rhythm:Regular Rate:Normal     Neuro/Psych    GI/Hepatic negative GI ROS, Neg liver ROS,   Endo/Other  negative endocrine ROS  Renal/GU negative Renal ROS     Musculoskeletal negative musculoskeletal ROS (+)   Abdominal   Peds  Hematology negative hematology ROS (+)   Anesthesia Other Findings   Reproductive/Obstetrics                           Anesthesia Physical Anesthesia Plan  ASA: I  Anesthesia Plan: General   Post-op Pain Management:    Induction: Intravenous  Airway Management Planned: Oral ETT and Video Laryngoscope Planned  Additional Equipment:   Intra-op Plan:   Post-operative Plan: Extubation in OR  Informed Consent: I have reviewed the patients History and Physical, chart, labs and discussed the procedure including the risks, benefits and alternatives for the proposed anesthesia with the patient or authorized representative who has indicated his/her understanding and acceptance.   Dental advisory given  Plan Discussed with: CRNA, Surgeon and Anesthesiologist  Anesthesia Plan Comments:       Anesthesia Quick Evaluation

## 2014-01-31 NOTE — Anesthesia Procedure Notes (Signed)
Procedure Name: Intubation Date/Time: 01/31/2014 7:43 AM Performed by: Rogelia BogaMUELLER, Jaze Rodino P Pre-anesthesia Checklist: Patient identified, Emergency Drugs available, Suction available, Patient being monitored and Timeout performed Patient Re-evaluated:Patient Re-evaluated prior to inductionOxygen Delivery Method: Circle system utilized Preoxygenation: Pre-oxygenation with 100% oxygen Intubation Type: IV induction Ventilation: Mask ventilation with difficulty Laryngoscope Size: Glidescope Tube type: Oral Tube size: 6.0 mm Number of attempts: 1 Airway Equipment and Method: Stylet and Video-laryngoscopy Placement Confirmation: ETT inserted through vocal cords under direct vision,  positive ETCO2 and breath sounds checked- equal and bilateral Secured at: 21 cm Tube secured with: Tape Dental Injury: Teeth and Oropharynx as per pre-operative assessment  Difficulty Due To: Difficulty was anticipated and Difficult Airway- due to limited oral opening Future Recommendations: Recommend- induction with short-acting agent, and alternative techniques readily available and Recommend- awake intubation Comments: Pt with hx of jaw injury with extremely limited mouth opening, Planned glide scope intubation, induction with Succinycholine, very difficult to mask ventilate, unable to get oral airway in mouth, dl X 1 with glide scope, very difficult to pry mouth open enough to pass glide scope blade, very difficult to pass 6.0 ETT into the oropharynx. Pt Expertly intubated by Dr. Jacklynn BueMassagee, 6.0 ETT pried into oropharynx and passed through cords under direct vision, ETT secured, + ETCO2, BBS=, VSS. Recommend awake intubation in the future if pt requires additional surgery.

## 2014-01-31 NOTE — Anesthesia Postprocedure Evaluation (Signed)
  Anesthesia Post-op Note  Patient: Mikayla Wiley  Procedure(s) Performed: Procedure(s): C5-6 Anterior Cervical Discectomy and Fusion (N/A)  Patient Location: PACU  Anesthesia Type:General  Level of Consciousness: awake and sedated  Airway and Oxygen Therapy: Patient Spontanous Breathing  Post-op Pain: mild  Post-op Assessment: Post-op Vital signs reviewed  Post-op Vital Signs: stable  Last Vitals:  Filed Vitals:   01/31/14 1115  BP: 100/58  Pulse:   Temp:   Resp:     Complications: No apparent anesthesia complications

## 2014-01-31 NOTE — Transfer of Care (Signed)
Immediate Anesthesia Transfer of Care Note  Patient: Mikayla Wiley  Procedure(s) Performed: Procedure(s): C5-6 Anterior Cervical Discectomy and Fusion (N/A)  Patient Location: PACU  Anesthesia Type:General  Level of Consciousness: awake, alert , oriented and patient cooperative  Airway & Oxygen Therapy: Patient Spontanous Breathing and Patient connected to nasal cannula oxygen  Post-op Assessment: Report given to RN, Post -op Vital signs reviewed and stable and Patient moving all extremities X 4  Post vital signs: Reviewed and stable  Last Vitals:  Filed Vitals:   01/31/14 0600  BP: 110/66  Pulse: 58  Temp: 36.8 C  Resp: 16    Complications: No apparent anesthesia complications

## 2014-01-31 NOTE — Interval H&P Note (Signed)
History and Physical Interval Note:  01/31/2014 7:07 AM  Mikayla PentaAnita W Wiley  has presented today for surgery, with the diagnosis of C5-6 Spondylosis  The various methods of treatment have been discussed with the patient and family. After consideration of risks, benefits and other options for treatment, the patient has consented to  Procedure(s): C5-6 Anterior Cervical Discectomy and Fusion, Allograft, Plate (N/A) as a surgical intervention .  The patient's history has been reviewed, patient examined, no change in status, stable for surgery.  I have reviewed the patient's chart and labs.  Questions were answered to the patient's satisfaction.     Reiana Poteet C

## 2014-02-01 DIAGNOSIS — M4722 Other spondylosis with radiculopathy, cervical region: Secondary | ICD-10-CM | POA: Diagnosis not present

## 2014-02-01 LAB — COMPREHENSIVE METABOLIC PANEL
ALK PHOS: 55 U/L (ref 39–117)
ALT: 9 U/L (ref 0–35)
AST: 25 U/L (ref 0–37)
Albumin: 3.6 g/dL (ref 3.5–5.2)
Anion gap: 6 (ref 5–15)
BUN: 7 mg/dL (ref 6–23)
CO2: 24 mmol/L (ref 19–32)
CREATININE: 0.98 mg/dL (ref 0.50–1.10)
Calcium: 8.9 mg/dL (ref 8.4–10.5)
Chloride: 105 mmol/L (ref 96–112)
GFR calc non Af Amer: 69 mL/min — ABNORMAL LOW (ref >90)
GFR, EST AFRICAN AMERICAN: 80 mL/min — AB (ref >90)
GLUCOSE: 101 mg/dL — AB (ref 70–99)
Potassium: 4.4 mmol/L (ref 3.5–5.1)
Sodium: 135 mmol/L (ref 135–145)
Total Bilirubin: 0.8 mg/dL (ref 0.3–1.2)
Total Protein: 6.5 g/dL (ref 6.0–8.3)

## 2014-02-01 NOTE — Op Note (Signed)
NAMESUNDAE, Mikayla Wiley NO.:  [id number]  MEDICAL RECORD NO.:  [id number]  LOCATION:  5N02C                        FACILITY:  [facility]  PHYSICIAN:  Mikayla Wiley C. Ophelia Charter, M.D.    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  POSTOPERATIVE DIAGNOSIS:  C5-6 spondylosis with foraminal stenosis.  PROCEDURE:  C5-6 anterior cervical diskectomy and fusion, allograft, and plate.  SURGEON:  Mikayla Wiley C. Ophelia Charter, M.D.  ASSISTANT:  Mikayla Kief, PA-C, medically necessary and present for the entire procedure.  ESTIMATED BLOOD LOSS:  Minimal.  DRAINS:  One Hemovac.  IMPLANTS:  A 6 mm cortical cancellous lordotic allograft.  Synthes 14 mm plate, 54-UJ screws x4.  DESCRIPTION OF PROCEDURE:  After induction of general anesthesia, intubation was very difficult.  The patient had old trauma getting her out to pull, her upper teeth had numerous dental fractures apparently TMJ problems, and is able to only open her mouth about 1.5 cm.  Glide scope was required and it was very difficult intubation by the anesthesiologist using the glide scope.  A #6 size tube was used.  Once she was intubated, airway established, arms were tucked to the side, yellow pads over the ulnar nerve.  Head halter traction applied, but no weight.  Area was squared with towels.  After DuraPrep, Ancef was given prophylactically.  Sterile skin marker and prominent skin fold appropriate levels starting at the midline extending to the left and half of a Betadine, Steri-Drape application.  Sterile Mayo stand at the head, thyroid sheets and drapes half sheet at the top.  Time-out procedure completed.  Incision was made starting at the midline extending to the left.  Platysma was divided in line with the fibers. Blunt dissection down to the spur at C5-6.  This was in her  neck underneath the omohyoid.  Spurs were removed after a short 25 needle was placed in the disk space confirmed with a lateral C-arm that had been draped confirming the appropriate level.  This was marked by excising the good portion of the disk out and self-retaining Cloward retractors were placed, teeth blades right and left, smooth blades up and down. Operative microscope was draped and brought in.  Diskectomy was performed.  Posterior longitudinal ligament was taken down under the microscope, spurs were removed.  There was abundant disk osteophyte complex primarily in the left where there was less foraminal stenosis. This was deforming the cord.  Once this was decompressed, cord was round.  Uncovertebral joints were stripped.  Trial sizers showed a 6 mm graft re-establish the disk space height.  It was checked under fluoroscopy.  A 6 mm graft was inserted with traction pulled by the CRNA.  Once the graft was inserted, 14 mm plate was selected.  DePuy Spine Skyline 14 mm plate, 12 mm screws x2, checked under fluoroscopy, good position and alignment of the plate.  All screws were locked in with a tiny screwdriver.  Irrigation and Hemovac drain placed and then platysma closed with 3-0 Vicryl, 4-0 Vicryl subcuticular.  Instrument count and needle  count was correct.  The patient tolerated the procedure well and was transferred to the recovery room.     Mikayla Wiley C. Ophelia CharterYates, M.D.     MCY/MEDQ  D:  01/31/2014  T:  02/01/2014  Job:  [id number]

## 2014-02-01 NOTE — Progress Notes (Signed)
Subjective: 1 Day Post-Op Procedure(s) (LRB): C5-6 Anterior Cervical Discectomy and Fusion (N/A) Patient reports pain as 5 on 0-10 scale.  Still with shoulder pain. No arm pain and left arm numbness gone.   Objective: Vital signs in last 24 hours: Temp:  [97.6 F (36.4 C)-98.2 F (36.8 C)] 98.2 F (36.8 C) (01/30 0403) Pulse Rate:  [47-66] 52 (01/30 0403) Resp:  [9-18] 18 (01/30 0403) BP: (81-130)/(44-83) 130/83 mmHg (01/30 0403) SpO2:  [98 %-100 %] 100 % (01/30 0403)  Intake/Output from previous day: 01/29 0701 - 01/30 0700 In: 1260 [P.O.:600; I.V.:660] Out: -  Intake/Output this shift: Total I/O In: 240 [P.O.:240] Out: -   No results for input(s): HGB in the last 72 hours. No results for input(s): WBC, RBC, HCT, PLT in the last 72 hours. No results for input(s): NA, K, CL, CO2, BUN, CREATININE, GLUCOSE, CALCIUM in the last 72 hours. No results for input(s): LABPT, INR in the last 72 hours.  Neurologically intact  Assessment/Plan: 1 Day Post-Op Procedure(s) (LRB): C5-6 Anterior Cervical Discectomy and Fusion (N/A) Plan:   Discharge home. Hv pulled d/c iv. Dressing change  Teralyn Mullins C 02/01/2014, 9:49 AM

## 2014-02-01 NOTE — Progress Notes (Signed)
Jeris PentaAnita W Johnson discharged home per MD order. Discharge instructions reviewed and discussed with patient. All questions and concerns answered. Copy of instructions and scripts given to patient. IV removed.  Patient escorted to car by staff in a wheelchair. No distress noted upon discharge.   Lorin PicketScott, [place] R 02/01/2014 11:51 AM .

## 2014-02-01 NOTE — Progress Notes (Signed)
Orthopedic Tech Progress Note Patient Details:  Mikayla Wiley Aug 31, 1970 [id number] Delivered for home use Ortho Devices Type of Ortho Device: Soft collar Ortho Device/Splint Interventions: Ordered   Asia Burnett KanarisR Thompson 02/01/2014, 10:54 AM

## 2014-02-03 ENCOUNTER — Encounter (HOSPITAL_COMMUNITY): Payer: Self-pay | Admitting: Orthopaedic Surgery

## 2014-02-25 NOTE — Discharge Summary (Signed)
Patient ID: Mikayla Wiley MRN: [id number] DOB/AGE: [birth year] 45 y.o.  Admit date: 01/31/2014 Discharge date: 02/25/2014  Admission Diagnoses:  Active Problems:   S/P cervical spinal fusion   Discharge Diagnoses:  Active Problems:   S/P cervical spinal fusion  status post Procedure(s): C5-6 Anterior Cervical Discectomy and Fusion  Past Medical History  Diagnosis Date  . Headache     migrains    Surgeries: Procedure(s): C5-6 Anterior Cervical Discectomy and Fusion on 01/31/2014   Consultants:    Discharged Condition: Improved  Hospital Course: Mikayla Wiley is an 44 y.o. female who was admitted 01/31/2014 for operative treatment of cervical ddd.   Patient failed conservative treatments (please see the history and physical for the specifics) and had severe unremitting pain that affects sleep, daily activities and work/hobbies. After pre-op clearance, the patient was taken to the operating room on 01/31/2014 and underwent  Procedure(s): C5-6 Anterior Cervical Discectomy and Fusion.    Patient was given perioperative antibiotics:  Anti-infectives    Start     Dose/Rate Route Frequency Ordered Stop   01/31/14 0600  ceFAZolin (ANCEF) IVPB 2 g/50 mL premix     2 g 100 mL/hr over 30 Minutes Intravenous On call to O.R. 01/30/14 1402 01/31/14 0745       Patient was given sequential compression devices and early ambulation to prevent DVT.   Patient benefited maximally from hospital stay and there were no complications. At the time of discharge, the patient was urinating/moving their bowels without difficulty, tolerating a regular diet, pain is controlled with oral pain medications and they have been cleared by PT/OT.   Recent vital signs: No data found.    Recent laboratory studies: No results for input(s): WBC, HGB, HCT, PLT, NA, K, CL, CO2, BUN, CREATININE, GLUCOSE, INR, CALCIUM in the last 72 hours.  Invalid input(s): PT, 2   Discharge Medications:     Medication  List    TAKE these medications        amitriptyline 25 MG tablet  Commonly known as:  ELAVIL  Take 1 tablet (25 mg total) by mouth at bedtime as needed for pain.     methocarbamol 500 MG tablet  Commonly known as:  ROBAXIN  Take 1 tablet (500 mg total) by mouth 4 (four) times daily.     ondansetron 4 MG disintegrating tablet  Commonly known as:  ZOFRAN ODT  Take 1 tablet (4 mg total) by mouth every 8 (eight) hours as needed.     oxyCODONE-acetaminophen 10-325 MG per tablet  Commonly known as:  PERCOCET  Take 1 tablet by mouth every 6 (six) hours as needed.        Diagnostic Studies: Dg Cervical Spine 2 Or 3 Views  01/31/2014   CLINICAL DATA:  Spondylosis and surgical fusion.  EXAM: DG C-ARM 61-120 MIN; CERVICAL SPINE - 2-3 VIEW  COMPARISON:  Cervical spine MRI 10/03/2013  FINDINGS: Two views demonstrated endotracheal tube and probable nasogastric tube. There is an anterior surgical plate at W0-J8C5-C6.  IMPRESSION: Anterior fusion at C5-C6.   Electronically Signed   By: Richarda OverlieAdam  Henn M.D.   On: 01/31/2014 09:52   Dg C-arm 1-60 Min  01/31/2014   CLINICAL DATA:  Spondylosis and surgical fusion.  EXAM: DG C-ARM 61-120 MIN; CERVICAL SPINE - 2-3 VIEW  COMPARISON:  Cervical spine MRI 10/03/2013  FINDINGS: Two views demonstrated endotracheal tube and probable nasogastric tube. There is an anterior surgical plate at J1-B1C5-C6.  IMPRESSION: Anterior fusion at C5-C6.  Electronically Signed   By: Richarda Overlie M.D.   On: 01/31/2014 09:52        Discharge Instructions    Call MD / Call 911    Complete by:  As directed   If you experience chest pain or shortness of breath, CALL 911 and be transported to the hospital emergency room.  If you develope a fever above 101 F, pus (white drainage) or increased drainage or redness at the wound, or calf pain, call your surgeon's office.     Constipation Prevention    Complete by:  As directed   Drink plenty of fluids.  Prune juice may be helpful.  You may use a  stool softener, such as Colace (over the counter) 100 mg twice a day.  Use MiraLax (over the counter) for constipation as needed.     Diet - low sodium heart healthy    Complete by:  As directed      Discharge instructions    Complete by:  As directed   Ok to shower 5 days postop.  Do not apply any creams or ointments to incision.    Can use 4x4 gauze and tape for dressing changes.  No aggressive activity. Cervical collar must be on at all times .  Do not bend or turn neck.     Driving restrictions    Complete by:  As directed   No driving until further notice.     Increase activity slowly as tolerated    Complete by:  As directed      Lifting restrictions    Complete by:  As directed   No lifting until further notice.           Follow-up Information    Follow up with Eldred Manges, MD.   Specialty:  Orthopedic Surgery   Why:  need return office visit one week.     Contact information:   [address] [place] [address] [phone]       Discharge Plan:  discharge to home  Disposition:     Signed: Naida Sleight  02/25/2014, 3:05 PM

## 2016-01-27 ENCOUNTER — Other Ambulatory Visit: Payer: Self-pay | Admitting: Internal Medicine

## 2016-01-27 DIAGNOSIS — Z1231 Encounter for screening mammogram for malignant neoplasm of breast: Secondary | ICD-10-CM

## 2016-01-29 ENCOUNTER — Other Ambulatory Visit: Payer: Self-pay | Admitting: Nurse Practitioner

## 2016-01-29 ENCOUNTER — Ambulatory Visit
Admission: RE | Admit: 2016-01-29 | Discharge: 2016-01-29 | Disposition: A | Discharge status: Home / Self Care | Payer: BLUE CROSS/BLUE SHIELD | Source: Ambulatory Visit | Attending: Internal Medicine | Admitting: Internal Medicine

## 2016-01-29 DIAGNOSIS — Z1231 Encounter for screening mammogram for malignant neoplasm of breast: Secondary | ICD-10-CM

## 2016-03-23 IMAGING — CR DG CHEST 2V
2 series · 2 of 2 positions shown · non-contrast
Comparison: None.

CLINICAL DATA: Preoperative evaluation prior to cervical spine
fusion, current history of headache

EXAM:
CHEST  2 VIEW

[w chest pa]
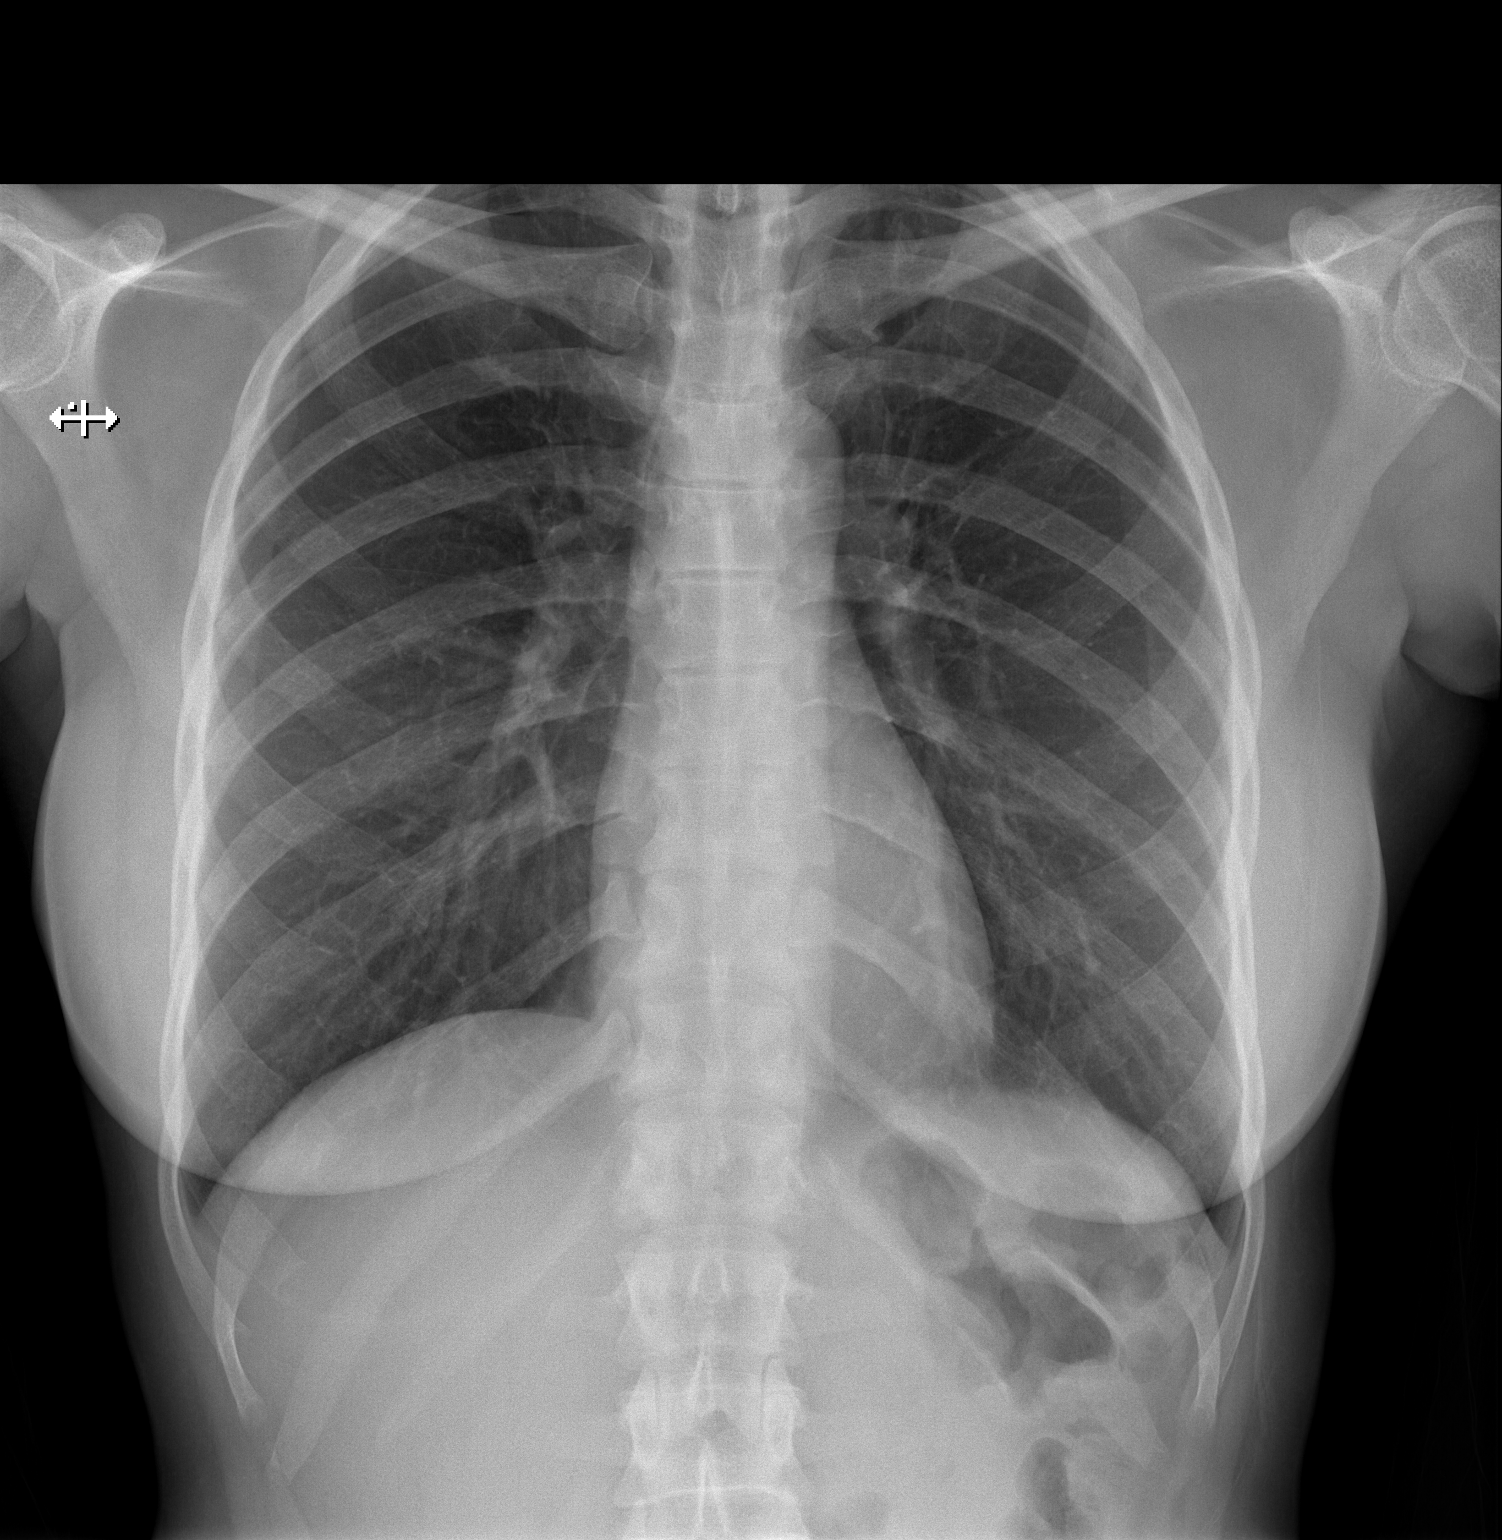

[w chest lat]
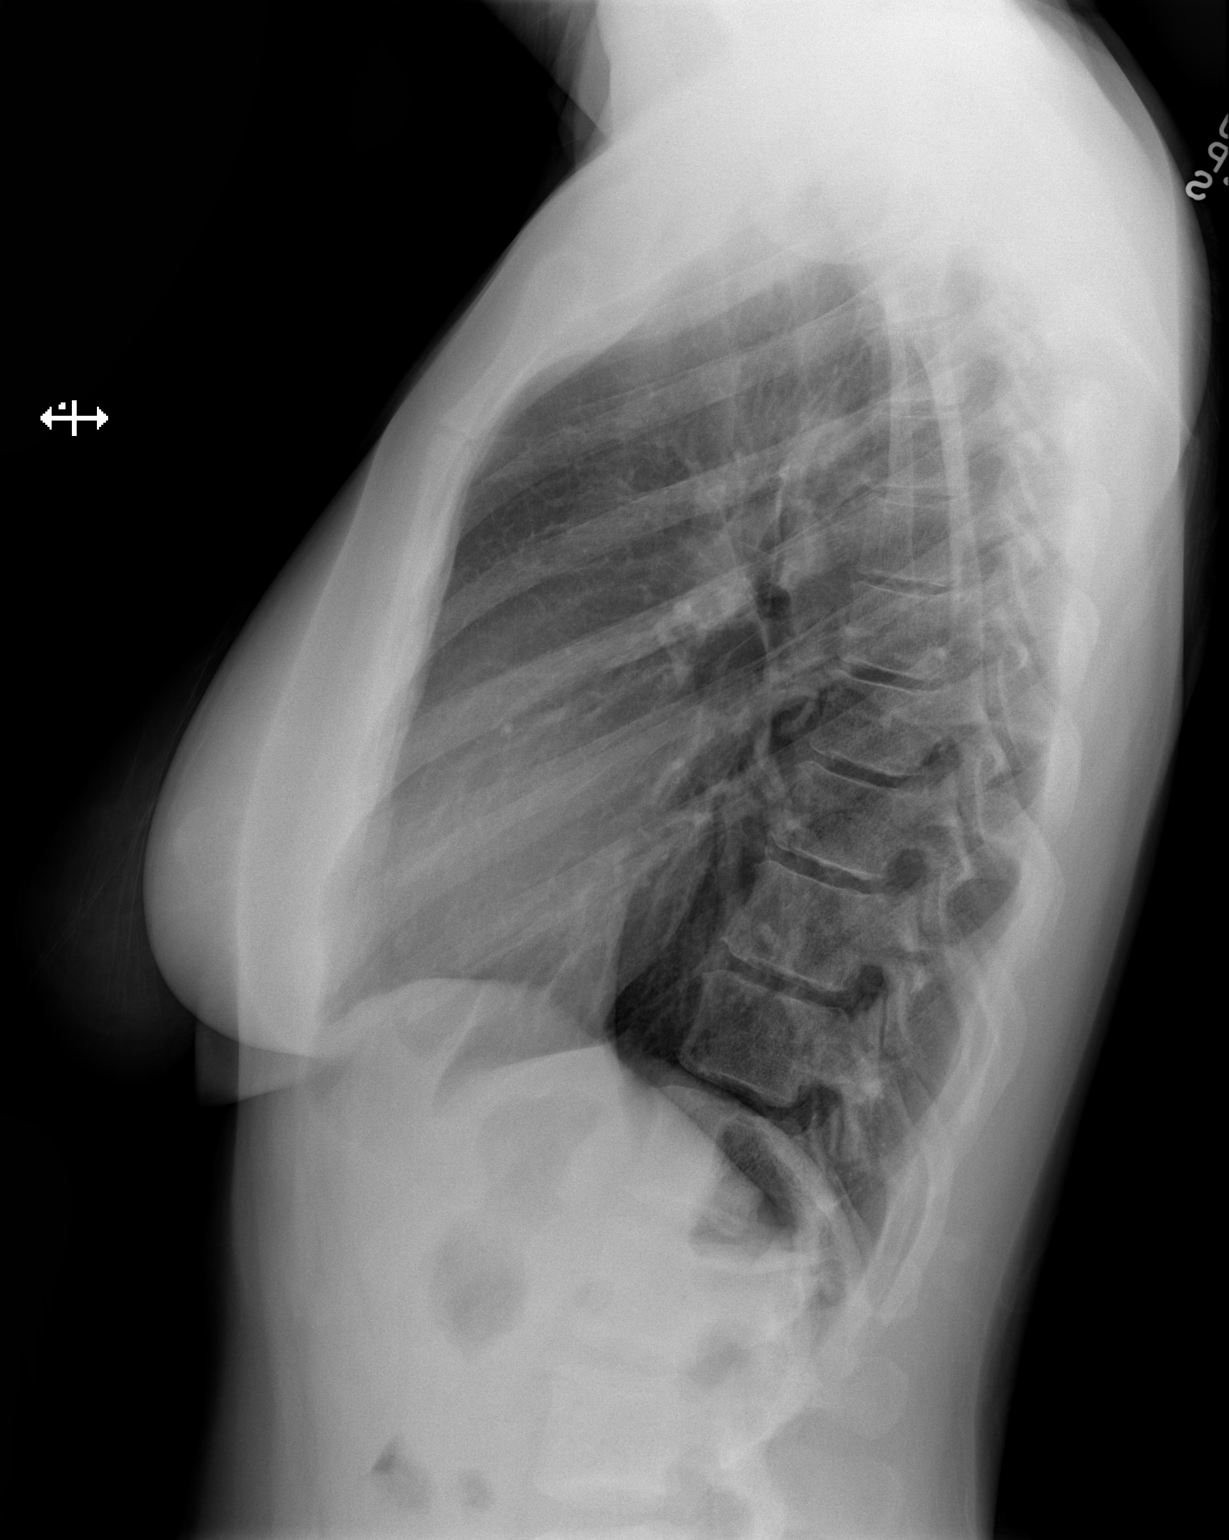

[2 of 2 positions shown; findings below may reference images not displayed]

FINDINGS: The heart size and mediastinal contours are within normal limits.
Both lungs are clear. The visualized skeletal structures are
unremarkable.
IMPRESSION: No active cardiopulmonary disease.

## 2016-11-01 ENCOUNTER — Other Ambulatory Visit (HOSPITAL_COMMUNITY)
Admission: RE | Admit: 2016-11-01 | Discharge: 2016-11-01 | Disposition: A | Discharge status: Home / Self Care | Payer: BLUE CROSS/BLUE SHIELD | Source: Ambulatory Visit | Attending: Nurse Practitioner | Admitting: Nurse Practitioner

## 2016-11-01 ENCOUNTER — Other Ambulatory Visit: Payer: Self-pay | Admitting: Nurse Practitioner

## 2016-11-01 DIAGNOSIS — Z01419 Encounter for gynecological examination (general) (routine) without abnormal findings: Secondary | ICD-10-CM | POA: Insufficient documentation

## 2016-11-03 LAB — CYTOLOGY - PAP
DIAGNOSIS: NEGATIVE
HPV: NOT DETECTED

## 2017-04-27 ENCOUNTER — Other Ambulatory Visit: Payer: Self-pay

## 2017-04-27 ENCOUNTER — Ambulatory Visit (HOSPITAL_COMMUNITY)
Admission: EM | Admit: 2017-04-27 | Discharge: 2017-04-27 | Disposition: A | Discharge status: Home / Self Care | Payer: BLUE CROSS/BLUE SHIELD | Attending: Internal Medicine | Admitting: Internal Medicine

## 2017-04-27 ENCOUNTER — Encounter (HOSPITAL_COMMUNITY): Payer: Self-pay | Admitting: Emergency Medicine

## 2017-04-27 DIAGNOSIS — M791 Myalgia, unspecified site: Secondary | ICD-10-CM

## 2017-04-27 DIAGNOSIS — S39012A Strain of muscle, fascia and tendon of lower back, initial encounter: Secondary | ICD-10-CM

## 2017-04-27 MED ORDER — IBUPROFEN 800 MG PO TABS: 800.0000 mg | ORAL_TABLET | Freq: Three times a day (TID) | ORAL | 0 refills | Status: DC

## 2017-04-27 MED ORDER — CYCLOBENZAPRINE HCL 10 MG PO TABS: 10.0000 mg | ORAL_TABLET | Freq: Two times a day (BID) | ORAL | 0 refills | Status: DC | PRN

## 2017-04-27 NOTE — Discharge Instructions (Signed)
I expect gradual improvement of your symptoms over the next 1 to 2 weeks.  You may apply heating pads or ice to help with discomfort and swelling.  Use anti-inflammatories for pain/swelling. You may take up to 800 mg Ibuprofen every 8 hours with food. You may supplement Ibuprofen with Tylenol [phone] mg every 8 hours.   You may take Flexeril as needed to help with pains.  This will cause sedation, please do not drive after use and limit use to when you will be at home or at that time.  Please follow-up if symptoms not improving or symptoms are worsening.
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
## 2017-06-09 ENCOUNTER — Encounter (INDEPENDENT_AMBULATORY_CARE_PROVIDER_SITE_OTHER): Payer: Self-pay | Admitting: Orthopedic Surgery

## 2017-06-09 ENCOUNTER — Ambulatory Visit (INDEPENDENT_AMBULATORY_CARE_PROVIDER_SITE_OTHER): Payer: BLUE CROSS/BLUE SHIELD

## 2017-06-09 ENCOUNTER — Ambulatory Visit (INDEPENDENT_AMBULATORY_CARE_PROVIDER_SITE_OTHER): Payer: BLUE CROSS/BLUE SHIELD | Admitting: Orthopedic Surgery

## 2017-06-09 DIAGNOSIS — M25561 Pain in right knee: Secondary | ICD-10-CM

## 2017-06-11 ENCOUNTER — Encounter (INDEPENDENT_AMBULATORY_CARE_PROVIDER_SITE_OTHER): Payer: Self-pay | Admitting: Orthopedic Surgery

## 2017-06-11 NOTE — Progress Notes (Signed)
Office Visit Note   Patient: Mikayla Wiley           Date of Birth: [birth year]           MRN: [id number] Visit Date: 06/09/2017 Requested by: No referring provider defined for this encounter. PCP: Patient, No Pcp Per  Subjective: Chief Complaint  Patient presents with  . Right Knee - Pain    HPI: Patient presents with right knee pain.  Denies any history of injury that she can recall.  She is had pain for well over 3 weeks.  Reports swelling weakness and difficulty with stairs.  She tried a knee sleeve but is not getting any better.  She had previous arthroscopy anyone.  Reports primarily lateral sided pain with swelling.  Has taken ibuprofen and elevation without much help.  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  She does report some mechanical symptoms.              ROS: All systems reviewed are negative as they relate to the chief complaint within the history of present illness.  Patient denies  fevers or chills.   Assessment & Plan: Visit Diagnoses:  1. Right knee pain, unspecified chronicity     Plan: Impression is right knee pain and lateral sided focal pain with normal radiographs.  Fairly high likelihood she may have lateral meniscal tear.  Signs and symptoms of iliotibial band bursitis not present.  Collateral and cruciates are stable.  Talked about several treatment options including injection and continuing to take anti-inflammatories.  Based on her mechanical symptoms and high level of activity coaching cheerleading I think MRI scan of the right knee would be helpful to try to determine if there is an operative problem in the knee.  Follow-Up Instructions: Return for after MRI.   Orders:  Orders Placed This Encounter  Procedures  . XR KNEE 3 VIEW RIGHT  . MR Knee Right w/o contrast   No orders of the defined types were placed in this encounter.     Procedures: No procedures  performed   Clinical Data: No additional findings.  Objective: Vital Signs: There were no vitals taken for this visit.  Physical Exam:   Constitutional: Patient appears well-developed HEENT:  Head: Normocephalic Eyes:EOM are normal Neck: Normal range of motion Cardiovascular: Normal rate Pulmonary/chest: Effort normal Neurologic: Patient is alert Skin: Skin is warm Psychiatric: Patient has normal mood and affect    Ortho Exam: Orthopedic exam demonstrates full active and passive range of motion of the right knee with trace effusion right versus left.  Stents are mechanism is intact.  Collateral cruciate ligaments are stable.  Is palpable.  No groin pain with internal/external rotation of the leg.  No other masses lymph adenopathy or skin changes noted in the right knee region.  She does have lateral greater than medial joint line tenderness and positive McMurray compression testing on that side.  Specialty Comments:  No specialty comments available.  Imaging: No results found.   PMFS History: Patient Active Problem List   Diagnosis Date Noted  . S/P cervical spinal fusion 01/31/2014   Past Medical History:  Diagnosis Date  . Headache    migrains    Family History  Problem Relation Age of Onset  . Healthy Mother   . Healthy Father     Past Surgical History:  Procedure Laterality Date  . ANTERIOR CERVICAL DECOMP/DISCECTOMY FUSION N/A 01/31/2014  Procedure: C5-6 Anterior Cervical Discectomy and Fusion;  Surgeon: Eldred MangesMark C Yates, MD;  Location: [facility];  Service: Orthopedics;  Laterality: N/A;  . CESAREAN SECTION    . KNEE ARTHROSCOPY Right 12/91   Social History   Occupational History  . Not on file  Tobacco Use  . Smoking status: Never Smoker  Substance and Sexual Activity  . Alcohol use: No  . Drug use: No  . Sexual activity: Not on file

## 2017-06-22 ENCOUNTER — Ambulatory Visit
Admission: RE | Admit: 2017-06-22 | Discharge: 2017-06-22 | Disposition: A | Discharge status: Home / Self Care | Payer: BLUE CROSS/BLUE SHIELD | Source: Ambulatory Visit | Attending: Orthopedic Surgery | Admitting: Orthopedic Surgery

## 2017-06-22 DIAGNOSIS — M25561 Pain in right knee: Secondary | ICD-10-CM

## 2017-06-22 DIAGNOSIS — M23361 Other meniscus derangements, other lateral meniscus, right knee: Secondary | ICD-10-CM | POA: Diagnosis not present

## 2017-07-05 ENCOUNTER — Encounter (INDEPENDENT_AMBULATORY_CARE_PROVIDER_SITE_OTHER): Payer: Self-pay | Admitting: Orthopedic Surgery

## 2017-07-05 ENCOUNTER — Ambulatory Visit (INDEPENDENT_AMBULATORY_CARE_PROVIDER_SITE_OTHER): Payer: BLUE CROSS/BLUE SHIELD | Admitting: Orthopedic Surgery

## 2017-07-05 DIAGNOSIS — S83511D Sprain of anterior cruciate ligament of right knee, subsequent encounter: Secondary | ICD-10-CM | POA: Diagnosis not present

## 2017-07-07 ENCOUNTER — Encounter (INDEPENDENT_AMBULATORY_CARE_PROVIDER_SITE_OTHER): Payer: Self-pay | Admitting: Orthopedic Surgery

## 2017-07-07 NOTE — Progress Notes (Signed)
Office Visit Note   Patient: Mikayla Wiley           Date of Birth: 1970/02/22           MRN: [id number] Visit Date: 07/05/2017 Requested by: No referring provider defined for this encounter. PCP: Patient, No Pcp Per  Subjective: Chief Complaint  Patient presents with  . Right Knee - Follow-up    HPI: Patient presents for follow-up evaluation of right knee pain and instability.  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  She does report some anterior knee pain going up and down stairs.  She states that the knee feels wobbly at times approximately 1-2 times a week.  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  She does like to work out by walking on the treadmill.  She works as a Education officer, environmentalpersonal banker.  She had arthroscopy in 1990 would which time it sounds like ACL tear was diagnosed.  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              ROS: All systems reviewed are negative as they relate to the chief complaint within the history of present illness.  Patient denies  fevers or chills.   Assessment & Plan: Visit Diagnoses:  1. Rupture of anterior cruciate ligament of right knee, subsequent encounter     Plan: Impression is isolated ACL tear with relatively spared medial and lateral compartments and patellofemoral compartments.  No evidence of meniscal pathology.  Plan is observation with generalized muscle motor strengthening.  And observation.  She is fairly functional at this time and does not do any high risk types of behaviors or activities.  See her back at the end of the year to discuss further surgical intervention if she wants to proceed.  For now I think she is fairly functional.  Follow-Up Instructions: Return if symptoms worsen or fail to improve.   Orders:  No orders of the defined types were placed in this encounter.  No orders of the defined types were placed in this encounter.     Procedures: No  procedures performed   Clinical Data: No additional findings.  Objective: Vital Signs: There were no vitals taken for this visit.  Physical Exam:   Constitutional: Patient appears well-developed HEENT:  Head: Normocephalic Eyes:EOM are normal Neck: Normal range of motion Cardiovascular: Normal rate Pulmonary/chest: Effort normal Neurologic: Patient is alert Skin: Skin is warm Psychiatric: Patient has normal mood and affect    Ortho Exam: Ortho exam demonstrates no effusion in the right knee with intact extensor mechanism and full range of motion.  ACL laxity is present on the right.  There is no posterior lateral rotatory instability and collateral and cruciate ligaments are stable and symmetric compared to the right-hand side to the left-hand side.  Specialty Comments:  No specialty comments available.  Imaging: No results found.   PMFS History: Patient Active Problem List   Diagnosis Date Noted  . S/P cervical spinal fusion 01/31/2014   Past Medical History:  Diagnosis Date  . Headache    migrains    Family History  Problem Relation Age of Onset  . Healthy Mother   . Healthy Father     Past Surgical History:  Procedure Laterality Date  . ANTERIOR CERVICAL DECOMP/DISCECTOMY FUSION N/A 01/31/2014   Procedure: C5-6 Anterior Cervical Discectomy and Fusion;  Surgeon: Eldred MangesMark C Yates, MD;  Location: [facility] OR;  Service: Orthopedics;  Laterality:  N/A;  . CESAREAN SECTION    . KNEE ARTHROSCOPY Right 12/91   Social History   Occupational History  . Not on file  Tobacco Use  . Smoking status: Never Smoker  . Smokeless tobacco: Never Used  Substance and Sexual Activity  . Alcohol use: No  . Drug use: No  . Sexual activity: Not on file

## 2017-11-02 DIAGNOSIS — Z01419 Encounter for gynecological examination (general) (routine) without abnormal findings: Secondary | ICD-10-CM | POA: Diagnosis not present

## 2017-11-08 ENCOUNTER — Other Ambulatory Visit: Payer: Self-pay

## 2017-11-08 ENCOUNTER — Emergency Department (HOSPITAL_COMMUNITY)
Admission: EM | Admit: 2017-11-08 | Discharge: 2017-11-09 | Disposition: A | Discharge status: Home / Self Care | Payer: BLUE CROSS/BLUE SHIELD | Attending: Emergency Medicine | Admitting: Emergency Medicine

## 2017-11-08 ENCOUNTER — Encounter (HOSPITAL_COMMUNITY): Payer: Self-pay | Admitting: Emergency Medicine

## 2017-11-08 DIAGNOSIS — M549 Dorsalgia, unspecified: Secondary | ICD-10-CM | POA: Diagnosis not present

## 2017-11-08 DIAGNOSIS — M62830 Muscle spasm of back: Secondary | ICD-10-CM

## 2017-11-08 DIAGNOSIS — M545 Low back pain: Secondary | ICD-10-CM | POA: Diagnosis not present

## 2017-11-08 MED ORDER — OXYCODONE-ACETAMINOPHEN 5-325 MG PO TABS
1.0000 | ORAL_TABLET | Freq: Once | ORAL | Status: AC
  Administered 2017-11-08: 1 via ORAL
  Filled 2017-11-08: qty 1

## 2017-11-08 MED ORDER — CYCLOBENZAPRINE HCL 10 MG PO TABS: 10.0000 mg | ORAL_TABLET | Freq: Three times a day (TID) | ORAL | 0 refills | Status: DC | PRN

## 2017-11-08 MED ORDER — HYDROCODONE-ACETAMINOPHEN 5-325 MG PO TABS: 1.0000 | ORAL_TABLET | Freq: Four times a day (QID) | ORAL | 0 refills | Status: DC | PRN

## 2017-11-08 MED ORDER — KETOROLAC TROMETHAMINE 60 MG/2ML IM SOLN
60.0000 mg | Freq: Once | INTRAMUSCULAR | Status: AC
  Administered 2017-11-08: 60 mg via INTRAMUSCULAR
  Filled 2017-11-08: qty 2

## 2017-11-08 MED ORDER — PREDNISONE 50 MG PO TABS: 50.0000 mg | ORAL_TABLET | Freq: Every day | ORAL | 0 refills | Status: DC

## 2017-11-08 MED ORDER — DIAZEPAM 5 MG PO TABS
5.0000 mg | ORAL_TABLET | Freq: Once | ORAL | Status: AC
  Administered 2017-11-08: 5 mg via ORAL
  Filled 2017-11-08: qty 1

## 2017-11-08 NOTE — Discharge Instructions (Addendum)
Return here as needed.  Follow-up with your doctor for recheck.  Ice and heat on your back.

## 2017-11-08 NOTE — ED Triage Notes (Signed)
Pt c/o back muscle spasms x 2 days, taking ibuprofen without relief.

## 2017-11-08 NOTE — ED Notes (Signed)
C/o back pain chrionic  She takes a muscle relaxer at home but tonight it did not work

## 2017-11-08 NOTE — ED Provider Notes (Signed)
[facility] EMERGENCY DEPARTMENT Provider Note   CSN: [id number] Arrival date & time: 11/08/17  2006     History   Chief Complaint No chief complaint on file.   HPI Mikayla Wiley is a 47 y.o. female.  HPI Patient presents to the emergency department with back spasms in her right lower back.  The patient states she did take some leftover muscle relaxer she had but did not work.  Patient states this happens intermittently to her.  Patient states that this is not usually a significant problem for her.  Patient states that certain movements palpation make the pain worse.  Patient denies fever, abdominal pain, nausea, vomiting, dysuria, incontinence, weakness, dizziness, numbness, near-syncope or syncope. Past Medical History:  Diagnosis Date  . Headache    migrains    Patient Active Problem List   Diagnosis Date Noted  . S/P cervical spinal fusion 01/31/2014    Past Surgical History:  Procedure Laterality Date  . ANTERIOR CERVICAL DECOMP/DISCECTOMY FUSION N/A 01/31/2014   Procedure: C5-6 Anterior Cervical Discectomy and Fusion;  Surgeon: Eldred Manges, MD;  Location: [facility] OR;  Service: Orthopedics;  Laterality: N/A;  . CESAREAN SECTION    . KNEE ARTHROSCOPY Right 12/91     OB History   None      Home Medications    Prior to Admission medications   Medication Sig Start Date End Date Taking? Authorizing Provider  cyclobenzaprine (FLEXERIL) 10 MG tablet Take 1 tablet (10 mg total) by mouth 2 (two) times daily as needed for muscle spasms. Patient not taking: Reported on 11/08/2017 04/27/17   Wieters, Hallie C, PA-C  ibuprofen (ADVIL,MOTRIN) 800 MG tablet Take 1 tablet (800 mg total) by mouth 3 (three) times daily. Patient not taking: Reported on 11/08/2017 04/27/17   Wieters, Hallie C, PA-C  ondansetron (ZOFRAN ODT) 4 MG disintegrating tablet Take 1 tablet (4 mg total) by mouth every 8 (eight) hours as needed. Patient not taking: Reported on 11/08/2017 01/31/14    Naida Sleight, PA-C    Family History Family History  Problem Relation Age of Onset  . Healthy Mother   . Healthy Father     Social History Social History   Tobacco Use  . Smoking status: Never Smoker  . Smokeless tobacco: Never Used  Substance Use Topics  . Alcohol use: No  . Drug use: No     Allergies   Patient has no known allergies.   Review of Systems Review of Systems  All other systems negative except as documented in the HPI. All pertinent positives and negatives as reviewed in the HPI. Physical Exam Updated Vital Signs BP 111/70   Pulse 70   Temp 98.5 F (36.9 C) (Oral)   Resp 16   Ht 5' 6.5" (1.689 m)   Wt 78 kg   SpO2 100%   BMI 27.35 kg/m   Physical Exam  Constitutional: She is oriented to person, place, and time. She appears well-developed and well-nourished. No distress.  HENT:  Head: Normocephalic and atraumatic.  Eyes: Pupils are equal, round, and reactive to light.  Cardiovascular: Normal rate and regular rhythm.  Pulmonary/Chest: Effort normal and breath sounds normal.  Musculoskeletal:       Lumbar back: She exhibits tenderness, pain and spasm. She exhibits normal range of motion, no bony tenderness and no swelling.       Back:  Neurological: She is alert and oriented to person, place, and time. She has normal strength. She displays  normal reflexes. No sensory deficit. She exhibits normal muscle tone. Coordination and gait normal. GCS eye subscore is 4. GCS verbal subscore is 5. GCS motor subscore is 6.  Skin: Skin is warm and dry.  Psychiatric: She has a normal mood and affect.  Nursing note and vitals reviewed.    ED Treatments / Results  Labs (all labs ordered are listed, but only abnormal results are displayed) Labs Reviewed - No data to display  EKG None  Radiology No results found.  Procedures Procedures (including critical care time)  Medications Ordered in ED Medications  oxyCODONE-acetaminophen  (PERCOCET/ROXICET) 5-325 MG per tablet 1 tablet (1 tablet Oral Given 11/08/17 2139)  diazepam (VALIUM) tablet 5 mg (5 mg Oral Given 11/08/17 2140)  ketorolac (TORADOL) injection 60 mg (60 mg Intramuscular Given 11/08/17 2136)     Initial Impression / Assessment and Plan / ED Course  I have reviewed the triage vital signs and the nursing notes.  Pertinent labs & imaging results that were available during my care of the patient were reviewed by me and considered in my medical decision making (see chart for details).     Patient has had relief with the medications given here in the emergency department.  We will have her follow-up with her doctor.  Patient has no neurological deficits noted on exam.  Final Clinical Impressions(s) / ED Diagnoses   Final diagnoses:  None    ED Discharge Orders    None       Charlestine Night, PA-C 11/08/17 2340    Jacalyn Lefevre, MD 11/08/17 2349

## 2017-11-08 NOTE — ED Notes (Signed)
The pt is feeling only sl better

## 2018-03-27 ENCOUNTER — Telehealth (INDEPENDENT_AMBULATORY_CARE_PROVIDER_SITE_OTHER): Payer: Self-pay | Admitting: Orthopedic Surgery

## 2018-03-27 ENCOUNTER — Telehealth (INDEPENDENT_AMBULATORY_CARE_PROVIDER_SITE_OTHER): Payer: Self-pay | Admitting: *Deleted

## 2018-03-27 NOTE — Telephone Encounter (Signed)
Called pt and lvm asking pt to call back to answered pre-screening questions.

## 2018-03-27 NOTE — Telephone Encounter (Signed)
Spoke with patient asked all pre-screening questions. The patient answered no to all questions.

## 2018-03-28 ENCOUNTER — Other Ambulatory Visit: Payer: Self-pay

## 2018-03-28 ENCOUNTER — Encounter (INDEPENDENT_AMBULATORY_CARE_PROVIDER_SITE_OTHER): Payer: Self-pay | Admitting: Orthopedic Surgery

## 2018-03-28 ENCOUNTER — Ambulatory Visit (INDEPENDENT_AMBULATORY_CARE_PROVIDER_SITE_OTHER): Payer: BLUE CROSS/BLUE SHIELD | Admitting: Orthopedic Surgery

## 2018-03-28 DIAGNOSIS — M2351 Chronic instability of knee, right knee: Secondary | ICD-10-CM | POA: Diagnosis not present

## 2018-03-28 MED ORDER — METHYLPREDNISOLONE ACETATE 40 MG/ML IJ SUSP
40.0000 mg | INTRAMUSCULAR | Status: AC | PRN
  Administered 2018-03-28: 40 mg via INTRA_ARTICULAR

## 2018-03-28 MED ORDER — LIDOCAINE HCL 1 % IJ SOLN
5.0000 mL | INTRAMUSCULAR | Status: AC | PRN
  Administered 2018-03-28: 5 mL

## 2018-03-28 MED ORDER — BUPIVACAINE HCL 0.25 % IJ SOLN
4.0000 mL | INTRAMUSCULAR | Status: AC | PRN
  Administered 2018-03-28: 4 mL via INTRA_ARTICULAR

## 2018-03-28 NOTE — Progress Notes (Signed)
Office Visit Note   Patient: Mikayla Wiley           Date of Birth: 1970-12-13           MRN: [id number] Visit Date: 03/28/2018 Requested by: [facility] [address] [place], [place] [address] PCP: [facility]  Subjective: Chief Complaint  Patient presents with   Right Knee - Pain    HPI: Mikayla Wiley is a patient with right knee pain.  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  She is not having much in the way of instability symptoms currently but she does have issues with pain.  Takes ibuprofen.  Works for [employer].  She states that when she sits for long period of time she reports swelling.  Localizes the pain globally around the knee not into any 1 particular area              ROS: All systems reviewed are negative as they relate to the chief complaint within the history of present illness.  Patient denies  fevers or chills.   Assessment & Plan: Visit Diagnoses: No diagnosis found.  Plan: Impression is right knee pain with ACL deficiency but no significant symptomatic instability currently.  She is having some pain.  Injection of the knee is performed today.  I will see her back as needed.  Follow-Up Instructions: Return if symptoms worsen or fail to improve.   Orders:  No orders of the defined types were placed in this encounter.  No orders of the defined types were placed in this encounter.     Procedures: Large Joint Inj: R knee on 03/28/2018 2:58 PM Indications: diagnostic evaluation, joint swelling and pain Details: 18 G 1.5 in needle, superolateral approach  Arthrogram: No  Medications: 5 mL lidocaine 1 %; 40 mg methylPREDNISolone acetate 40 MG/ML; 4 mL bupivacaine 0.25 % Outcome: tolerated well, no immediate complications Procedure, treatment alternatives, risks and benefits explained, specific risks discussed. Consent was given by the patient. Immediately prior to procedure a time  out was called to verify the correct patient, procedure, equipment, support staff and site/side marked as required. Patient was prepped and draped in the usual sterile fashion.       Clinical Data: No additional findings.  Objective: Vital Signs: There were no vitals taken for this visit.  Physical Exam:   Constitutional: Patient appears well-developed HEENT:  Head: Normocephalic Eyes:EOM are normal Neck: Normal range of motion Cardiovascular: Normal rate Pulmonary/chest: Effort normal Neurologic: Patient is alert Skin: Skin is warm Psychiatric: Patient has normal mood and affect    Ortho Exam: Ortho exam demonstrates good range of motion of the right knee with stable collateral ligaments.  Slight ACL laxity is present consistent with her prior exam.  No focal joint line tenderness is present.  Extensor mechanism is intact.  No other masses lymphadenopathy or skin changes noted in that right knee region.  Specialty Comments:  No specialty comments available.  Imaging: No results found.   PMFS History: Patient Active Problem List   Diagnosis Date Noted   S/P cervical spinal fusion 01/31/2014   Past Medical History:  Diagnosis Date   Headache    migrains    Family History  Problem Relation Age of Onset   Healthy Mother    Healthy Father     Past Surgical History:  Procedure Laterality Date   ANTERIOR CERVICAL DECOMP/DISCECTOMY FUSION N/A 01/31/2014   Procedure: C5-6  Anterior Cervical Discectomy and Fusion;  Surgeon: Eldred Manges, MD;  Location: [facility] OR;  Service: Orthopedics;  Laterality: N/A;   CESAREAN SECTION     KNEE ARTHROSCOPY Right 12/91   Social History   Occupational History   Not on file  Tobacco Use   Smoking status: Never Smoker   Smokeless tobacco: Never Used  Substance and Sexual Activity   Alcohol use: No   Drug use: No   Sexual activity: Not on file

## 2018-03-29 ENCOUNTER — Telehealth (INDEPENDENT_AMBULATORY_CARE_PROVIDER_SITE_OTHER): Payer: Self-pay

## 2018-03-29 NOTE — Telephone Encounter (Signed)
Spoke with Dr August Saucer He advised ice,anti-inflammatory and rest and have patient come in the morning for him to look at knee.  I tried calling her to discuss. No answer. LMVM advising per Dr August Saucer.

## 2018-03-29 NOTE — Telephone Encounter (Signed)
Voicemail left at the front desk and then transferred to triage line. Pt had cortisone injection yesterday. Having extreme pain today and trouble stretching leg and walking. Please call to discuss.

## 2018-03-29 NOTE — Telephone Encounter (Signed)
Also advised her to call back to be worked in to see Dr August Saucer in the morning.

## 2018-03-30 ENCOUNTER — Ambulatory Visit (INDEPENDENT_AMBULATORY_CARE_PROVIDER_SITE_OTHER): Payer: BLUE CROSS/BLUE SHIELD | Admitting: Orthopedic Surgery

## 2018-03-30 ENCOUNTER — Encounter (INDEPENDENT_AMBULATORY_CARE_PROVIDER_SITE_OTHER): Payer: Self-pay | Admitting: Orthopedic Surgery

## 2018-03-30 ENCOUNTER — Other Ambulatory Visit: Payer: Self-pay

## 2018-03-30 DIAGNOSIS — M2351 Chronic instability of knee, right knee: Secondary | ICD-10-CM

## 2018-03-30 NOTE — Progress Notes (Signed)
   Post-Op Visit Note   Patient: Mikayla Wiley           Date of Birth: Jun 25, 1970           MRN: [id number] Visit Date: 03/30/2018 PCP: [facility]   Assessment & Plan:  Chief Complaint: No chief complaint on file.  Visit Diagnoses:  1. 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   Orders:  No orders of the defined types were placed in this encounter.  No orders of the defined types were placed in this encounter.   Imaging: No results found.  PMFS History: Patient Active Problem List   Diagnosis Date Noted  . S/P cervical spinal fusion 01/31/2014   Past Medical History:  Diagnosis Date  . Headache    migrains    Family History  Problem Relation Age of Onset  . Healthy Mother   . Healthy Father     Past Surgical History:  Procedure Laterality Date  . ANTERIOR CERVICAL DECOMP/DISCECTOMY FUSION N/A 01/31/2014   Procedure: C5-6 Anterior Cervical Discectomy and Fusion;  Surgeon: Eldred Manges, MD;  Location: [facility] OR;  Service: Orthopedics;  Laterality: N/A;  . CESAREAN SECTION    . KNEE ARTHROSCOPY Right 12/91   Social History   Occupational History  . Not on file  Tobacco Use  . Smoking status: Never Smoker  . Smokeless tobacco: Never Used  Substance and Sexual Activity  . Alcohol use: No  . Drug use: No  . Sexual activity: Not on file   Patient comes in today.  Had an injection in her knee on the right-hand side yesterday for arthritic symptoms.  She is having some pain.  On exam there is no effusion and good range of motion.  Most of the pain is around her injection site.  No fevers and chills.  I wanted to have her try some topical and then will continue with the plan as outlined in the prior note.

## 2018-10-24 DIAGNOSIS — Z20828 Contact with and (suspected) exposure to other viral communicable diseases: Secondary | ICD-10-CM | POA: Diagnosis not present

## 2018-11-08 DIAGNOSIS — Z01419 Encounter for gynecological examination (general) (routine) without abnormal findings: Secondary | ICD-10-CM | POA: Diagnosis not present

## 2018-11-08 DIAGNOSIS — N898 Other specified noninflammatory disorders of vagina: Secondary | ICD-10-CM | POA: Diagnosis not present

## 2018-11-08 DIAGNOSIS — M79669 Pain in unspecified lower leg: Secondary | ICD-10-CM | POA: Diagnosis not present

## 2018-11-08 DIAGNOSIS — R3989 Other symptoms and signs involving the genitourinary system: Secondary | ICD-10-CM | POA: Diagnosis not present

## 2018-11-27 DIAGNOSIS — G609 Hereditary and idiopathic neuropathy, unspecified: Secondary | ICD-10-CM | POA: Diagnosis not present

## 2018-11-27 DIAGNOSIS — M79644 Pain in right finger(s): Secondary | ICD-10-CM | POA: Diagnosis not present

## 2018-12-04 DIAGNOSIS — U071 COVID-19: Secondary | ICD-10-CM

## 2018-12-04 HISTORY — DX: COVID-19: U07.1

## 2018-12-27 ENCOUNTER — Ambulatory Visit: Payer: BC Managed Care – PPO | Attending: Internal Medicine

## 2018-12-27 DIAGNOSIS — Z20822 Contact with and (suspected) exposure to covid-19: Secondary | ICD-10-CM

## 2018-12-27 DIAGNOSIS — Z20828 Contact with and (suspected) exposure to other viral communicable diseases: Secondary | ICD-10-CM | POA: Diagnosis not present

## 2018-12-28 LAB — NOVEL CORONAVIRUS, NAA: SARS-CoV-2, NAA: NOT DETECTED

## 2019-01-02 ENCOUNTER — Ambulatory Visit: Payer: BC Managed Care – PPO | Attending: Internal Medicine

## 2019-01-02 DIAGNOSIS — Z20822 Contact with and (suspected) exposure to covid-19: Secondary | ICD-10-CM

## 2019-01-02 DIAGNOSIS — Z20828 Contact with and (suspected) exposure to other viral communicable diseases: Secondary | ICD-10-CM | POA: Diagnosis not present

## 2019-01-03 ENCOUNTER — Encounter (HOSPITAL_BASED_OUTPATIENT_CLINIC_OR_DEPARTMENT_OTHER): Payer: Self-pay | Admitting: Radiology

## 2019-01-03 ENCOUNTER — Other Ambulatory Visit: Payer: Self-pay

## 2019-01-03 ENCOUNTER — Emergency Department (HOSPITAL_BASED_OUTPATIENT_CLINIC_OR_DEPARTMENT_OTHER)
Admission: EM | Admit: 2019-01-03 | Discharge: 2019-01-03 | Disposition: A | Discharge status: Home / Self Care | Payer: BC Managed Care – PPO | Attending: Emergency Medicine | Admitting: Emergency Medicine

## 2019-01-03 ENCOUNTER — Emergency Department (HOSPITAL_BASED_OUTPATIENT_CLINIC_OR_DEPARTMENT_OTHER): Payer: BC Managed Care – PPO

## 2019-01-03 DIAGNOSIS — Z20828 Contact with and (suspected) exposure to other viral communicable diseases: Secondary | ICD-10-CM | POA: Insufficient documentation

## 2019-01-03 DIAGNOSIS — R1031 Right lower quadrant pain: Secondary | ICD-10-CM | POA: Insufficient documentation

## 2019-01-03 DIAGNOSIS — Z20822 Contact with and (suspected) exposure to covid-19: Secondary | ICD-10-CM

## 2019-01-03 DIAGNOSIS — R109 Unspecified abdominal pain: Secondary | ICD-10-CM | POA: Diagnosis not present

## 2019-01-03 LAB — COMPREHENSIVE METABOLIC PANEL
ALT: 12 U/L (ref 0–44)
AST: 19 U/L (ref 15–41)
Albumin: 3.8 g/dL (ref 3.5–5.0)
Alkaline Phosphatase: 62 U/L (ref 38–126)
Anion gap: 8 (ref 5–15)
BUN: 13 mg/dL (ref 6–20)
CO2: 22 mmol/L (ref 22–32)
Calcium: 8.6 mg/dL — ABNORMAL LOW (ref 8.9–10.3)
Chloride: 109 mmol/L (ref 98–111)
Creatinine, Ser: 1.18 mg/dL — ABNORMAL HIGH (ref 0.44–1.00)
GFR calc Af Amer: >60 mL/min (ref >60)
GFR calc non Af Amer: 54 mL/min — ABNORMAL LOW (ref >60)
Glucose, Bld: 101 mg/dL — ABNORMAL HIGH (ref 70–99)
Potassium: 3.8 mmol/L (ref 3.5–5.1)
Sodium: 139 mmol/L (ref 135–145)
Total Bilirubin: 0.5 mg/dL (ref 0.3–1.2)
Total Protein: 7 g/dL (ref 6.5–8.1)

## 2019-01-03 LAB — CBC
HCT: 41.6 % (ref 36.0–46.0)
Hemoglobin: 13.7 g/dL (ref 12.0–15.0)
MCH: 30.6 pg (ref 26.0–34.0)
MCHC: 32.9 g/dL (ref 30.0–36.0)
MCV: 92.9 fL (ref 80.0–100.0)
Platelets: 180 10*3/uL (ref 150–400)
RBC: 4.48 MIL/uL (ref 3.87–5.11)
RDW: 12.8 % (ref 11.5–15.5)
WBC: 2.7 10*3/uL — ABNORMAL LOW (ref 4.0–10.5)
nRBC: 0 % (ref 0.0–0.2)

## 2019-01-03 LAB — URINALYSIS, ROUTINE W REFLEX MICROSCOPIC
Bilirubin Urine: NEGATIVE
Glucose, UA: NEGATIVE mg/dL
Ketones, ur: NEGATIVE mg/dL
Nitrite: NEGATIVE
Protein, ur: 100 mg/dL — AB
Specific Gravity, Urine: >1.03 — ABNORMAL HIGH (ref 1.005–1.030)
pH: 5 (ref 5.0–8.0)

## 2019-01-03 LAB — URINALYSIS, MICROSCOPIC (REFLEX): RBC / HPF: >50 RBC/hpf (ref 0–5)

## 2019-01-03 LAB — LIPASE, BLOOD: Lipase: 23 U/L (ref 11–51)

## 2019-01-03 LAB — PREGNANCY, URINE: Preg Test, Ur: NEGATIVE

## 2019-01-03 MED ORDER — MORPHINE SULFATE (PF) 4 MG/ML IV SOLN
4.0000 mg | Freq: Once | INTRAVENOUS | Status: AC
  Administered 2019-01-03: 4 mg via INTRAVENOUS
  Filled 2019-01-03: qty 1

## 2019-01-03 MED ORDER — SODIUM CHLORIDE 0.9 % IV BOLUS
1000.0000 mL | Freq: Once | INTRAVENOUS | Status: AC
  Administered 2019-01-03: 21:00:00 1000 mL via INTRAVENOUS

## 2019-01-03 MED ORDER — CEPHALEXIN 500 MG PO CAPS: 500.0000 mg | ORAL_CAPSULE | Freq: Two times a day (BID) | ORAL | 0 refills | Status: AC

## 2019-01-03 MED ORDER — IOHEXOL 300 MG/ML  SOLN
100.0000 mL | Freq: Once | INTRAMUSCULAR | Status: AC | PRN
  Administered 2019-01-03: 100 mL via INTRAVENOUS

## 2019-01-03 NOTE — ED Notes (Signed)
Patient transported to CT 

## 2019-01-03 NOTE — ED Provider Notes (Signed)
[facility] Provider Note   CSN: [id number] Arrival date & time: 01/03/19  1828     History Chief Complaint  Patient presents with  . Abdominal Pain    Mikayla Wiley is a 48 y.o. female.  HPI      Son Corky Sox has COVID and quarantined at home, wearing a mask Initial test was negative Father has also tested positive now Second test as of yesterday  Had some abdominal pain, took some tylenol sinus headache which helped some Zinc, vitamin C Body aches Last night took ibuprofen with helped abdominal pain  Right sided abdominal aching has been present for last 3 days Last 2 nights has been hard to sleep, sensitive to touch Waxing and waning some aching right lower side to right flank No blood in urine until was here Pain currently 8-9/10   Appetite ok, no loss of taste or smell No nausea vomiting or diarrhea No fevers or chills  Was having body aches for a day No cough/dyspnea, sore throat nor runny nose  No urinary symptoms  Past Medical History:  Diagnosis Date  . Headache    migrains    Patient Active Problem List   Diagnosis Date Noted  . S/P cervical spinal fusion 01/31/2014    Past Surgical History:  Procedure Laterality Date  . ANTERIOR CERVICAL DECOMP/DISCECTOMY FUSION N/A 01/31/2014   Procedure: C5-6 Anterior Cervical Discectomy and Fusion;  Surgeon: Marybelle Killings, MD;  Location: [facility];  Service: Orthopedics;  Laterality: N/A;  . CESAREAN SECTION    . KNEE ARTHROSCOPY Right 12/91     OB History   No obstetric history on file.     Family History  Problem Relation Age of Onset  . Healthy Mother   . Healthy Father     Social History   Tobacco Use  . Smoking status: Never Smoker  . Smokeless tobacco: Never Used  Substance Use Topics  . Alcohol use: No  . Drug use: No    Home Medications Prior to Admission medications   Medication Sig Start Date End Date Taking? Authorizing Provider  cephALEXin (KEFLEX)  500 MG capsule Take 1 capsule (500 mg total) by mouth 2 (two) times daily for 7 days. 01/03/19 01/10/19  Gareth Morgan, MD  cyclobenzaprine (FLEXERIL) 10 MG tablet Take 1 tablet (10 mg total) by mouth 3 (three) times daily as needed for muscle spasms. 11/08/17   Lawyer, Harrell Gave, PA-C  HYDROcodone-acetaminophen (NORCO/VICODIN) 5-325 MG tablet Take 1 tablet by mouth every 6 (six) hours as needed for moderate pain. 11/08/17   Lawyer, Harrell Gave, PA-C  ibuprofen (ADVIL,MOTRIN) 800 MG tablet Take 1 tablet (800 mg total) by mouth 3 (three) times daily. Patient not taking: Reported on 11/08/2017 04/27/17   Wieters, Hallie C, PA-C  ondansetron (ZOFRAN ODT) 4 MG disintegrating tablet Take 1 tablet (4 mg total) by mouth every 8 (eight) hours as needed. Patient not taking: Reported on 11/08/2017 01/31/14   Lanae Crumbly, PA-C  predniSONE (DELTASONE) 50 MG tablet Take 1 tablet (50 mg total) by mouth daily with breakfast. 11/08/17   Dalia Heading, PA-C    Allergies    Patient has no known allergies.  Review of Systems   Review of Systems  Constitutional: Negative for fever.  HENT: Negative for sore throat.   Eyes: Negative for visual disturbance.  Respiratory: Negative for cough and shortness of breath.   Cardiovascular: Negative for chest pain.  Gastrointestinal: Positive for abdominal pain. Negative for constipation, diarrhea,  nausea and vomiting.  Genitourinary: Negative for difficulty urinating.  Musculoskeletal: Positive for arthralgias and myalgias. Negative for back pain and neck pain.  Skin: Negative for rash.  Neurological: Negative for syncope and headaches.    Physical Exam Updated Vital Signs BP 114/75 (BP Location: Right Arm)   Pulse 67   Temp 98.5 F (36.9 C) (Oral)   Resp 20   Ht 5\' 7"  (1.702 m)   Wt 79.4 kg   SpO2 100%   BMI 27.41 kg/m   Physical Exam Vitals and nursing note reviewed.  Constitutional:      General: She is not in acute distress.    Appearance: She  is well-developed. She is not diaphoretic.  HENT:     Head: Normocephalic and atraumatic.  Eyes:     Conjunctiva/sclera: Conjunctivae normal.  Cardiovascular:     Rate and Rhythm: Normal rate and regular rhythm.  Pulmonary:     Effort: Pulmonary effort is normal. No respiratory distress.  Abdominal:     General: There is no distension.     Palpations: Abdomen is soft.     Tenderness: There is abdominal tenderness in the right lower quadrant. There is no guarding.  Musculoskeletal:        General: No tenderness.     Cervical back: Normal range of motion.  Skin:    General: Skin is warm and dry.     Findings: No erythema or rash.  Neurological:     Mental Status: She is alert and oriented to person, place, and time.     ED Results / Procedures / Treatments   Labs (all labs ordered are listed, but only abnormal results are displayed) Labs Reviewed  COMPREHENSIVE METABOLIC PANEL - Abnormal; Notable for the following components:      Result Value   Glucose, Bld 101 (*)    Creatinine, Ser 1.18 (*)    Calcium 8.6 (*)    GFR calc non Af Amer 54 (*)    All other components within normal limits  CBC - Abnormal; Notable for the following components:   WBC 2.7 (*)    All other components within normal limits  URINALYSIS, ROUTINE W REFLEX MICROSCOPIC - Abnormal; Notable for the following components:   APPearance CLOUDY (*)    Specific Gravity, Urine >1.030 (*)    Hgb urine dipstick LARGE (*)    Protein, ur 100 (*)    Leukocytes,Ua TRACE (*)    All other components within normal limits  URINALYSIS, MICROSCOPIC (REFLEX) - Abnormal; Notable for the following components:   Bacteria, UA FEW (*)    All other components within normal limits  LIPASE, BLOOD  PREGNANCY, URINE    EKG None  Radiology CT ABDOMEN PELVIS W CONTRAST  Result Date: 01/03/2019 CLINICAL DATA:  Right lower quadrant abdominal pain EXAM: CT ABDOMEN AND PELVIS WITH CONTRAST TECHNIQUE: Multidetector CT imaging  of the abdomen and pelvis was performed using the standard protocol following bolus administration of intravenous contrast. CONTRAST:  100mL OMNIPAQUE IOHEXOL 300 MG/ML  SOLN COMPARISON:  None. FINDINGS: Lower chest: Minimal dependent subsegmental atelectasis. Hepatobiliary: No focal liver abnormality is seen. No gallstones, gallbladder wall thickening, or biliary dilatation. Pancreas: Unremarkable. No pancreatic ductal dilatation or surrounding inflammatory changes. Spleen: Normal in size without focal abnormality. Adrenals/Urinary Tract: Unremarkable adrenal glands. Bilateral kidneys appear unremarkable without calculus, mass, or hydronephrosis. Urinary bladder is unremarkable. Stomach/Bowel: Stomach is within normal limits. Appendix appears normal. Mild-to-moderate volume of stool within the colon. There is some fecalization  of small bowel contents. No evidence of bowel wall thickening, distention, or inflammatory changes. Vascular/Lymphatic: No significant vascular findings are present. No enlarged abdominal or pelvic lymph nodes. Reproductive: Slightly heterogeneous appearance of the uterus may reflect underlying fibroids. Adnexal regions are within normal limits. Other: Small fat containing umbilical hernia. No ascites. Musculoskeletal: No acute or significant osseous findings. IMPRESSION: 1. No acute intra-abdominal or intrapelvic pathology. Normal appendix. 2. Mild-to-moderate volume of stool within the colon and fecalization of small bowel contents, which can be seen with delayed intestinal transit/constipation. No evidence of bowel obstruction. 3. Small fat containing umbilical hernia. 4. Heterogeneous appearance of the uterus may reflect underlying fibroids. Electronically Signed   By: Duanne Guess D.O.   On: 01/03/2019 21:45    Procedures Procedures (including critical care time)  Medications Ordered in ED Medications  sodium chloride 0.9 % bolus 1,000 mL (0 mLs Intravenous Stopped 01/03/19  2241)  morphine 4 MG/ML injection 4 mg (4 mg Intravenous Given 01/03/19 2114)  iohexol (OMNIPAQUE) 300 MG/ML solution 100 mL (100 mLs Intravenous Contrast Given 01/03/19 2126)    ED Course  I have reviewed the triage vital signs and the nursing notes.  Pertinent labs & imaging results that were available during my care of the patient were reviewed by me and considered in my medical decision making (see chart for details).    MDM Rules/Calculators/A&P                       48yo female with COVID 19 test pending (son and father positive) presents with concern for right sided abdominal pain.  CT shows no sign of appendicitis or nephrolithiasis. Pregnancy test negative.  UA with blood, WBC, given right sided abdominal and flank pian, feel it is reasonable to treat for UTI as possible etiology but overall lower suspicion.  Discussed possible constipation on CT and also discussed leukopenia and known COVID contacts with body aches also likely consistent with COVID 19 infection. Recommend continued quarantine, covid test pending at this time.   Recommend continued supportive care. Patient discharged in stable condition with understanding of reasons to return.     Final Clinical Impression(s) / ED Diagnoses Final diagnoses:  Right lower quadrant abdominal pain  COVID-19 virus test result unknown    Rx / DC Orders ED Discharge Orders         Ordered    cephALEXin (KEFLEX) 500 MG capsule  2 times daily     01/03/19 2216           Alvira Monday, MD 01/04/19 1328

## 2019-01-03 NOTE — ED Triage Notes (Signed)
Patient reports RLQ abdominal and flank pain which began 2 days ago.  Denies dysuria, hematuria, N/V/D.  Endorses recent covid exposure with 1 negative test and 1 pending.  Denies cough, fever, chills, shortness of breath.

## 2019-01-04 LAB — NOVEL CORONAVIRUS, NAA: SARS-CoV-2, NAA: DETECTED — AB

## 2019-01-10 ENCOUNTER — Ambulatory Visit: Payer: BC Managed Care – PPO | Attending: Internal Medicine

## 2019-01-10 DIAGNOSIS — Z20822 Contact with and (suspected) exposure to covid-19: Secondary | ICD-10-CM

## 2019-01-12 LAB — NOVEL CORONAVIRUS, NAA: SARS-CoV-2, NAA: NOT DETECTED

## 2019-02-11 DIAGNOSIS — Z1159 Encounter for screening for other viral diseases: Secondary | ICD-10-CM | POA: Diagnosis not present

## 2019-02-14 DIAGNOSIS — Z1211 Encounter for screening for malignant neoplasm of colon: Secondary | ICD-10-CM | POA: Diagnosis not present

## 2019-02-14 DIAGNOSIS — K635 Polyp of colon: Secondary | ICD-10-CM | POA: Diagnosis not present

## 2019-04-01 DIAGNOSIS — M62838 Other muscle spasm: Secondary | ICD-10-CM | POA: Diagnosis not present

## 2019-04-01 DIAGNOSIS — G43119 Migraine with aura, intractable, without status migrainosus: Secondary | ICD-10-CM | POA: Diagnosis not present

## 2019-05-02 DIAGNOSIS — M7662 Achilles tendinitis, left leg: Secondary | ICD-10-CM | POA: Diagnosis not present

## 2019-05-02 DIAGNOSIS — M7661 Achilles tendinitis, right leg: Secondary | ICD-10-CM | POA: Diagnosis not present

## 2019-05-22 DIAGNOSIS — M79672 Pain in left foot: Secondary | ICD-10-CM | POA: Diagnosis not present

## 2019-05-22 DIAGNOSIS — M7661 Achilles tendinitis, right leg: Secondary | ICD-10-CM | POA: Diagnosis not present

## 2019-05-22 DIAGNOSIS — M79671 Pain in right foot: Secondary | ICD-10-CM | POA: Diagnosis not present

## 2019-05-22 DIAGNOSIS — R2689 Other abnormalities of gait and mobility: Secondary | ICD-10-CM | POA: Diagnosis not present

## 2019-05-22 DIAGNOSIS — M722 Plantar fascial fibromatosis: Secondary | ICD-10-CM | POA: Diagnosis not present

## 2019-05-22 DIAGNOSIS — M792 Neuralgia and neuritis, unspecified: Secondary | ICD-10-CM | POA: Diagnosis not present

## 2019-06-10 DIAGNOSIS — M792 Neuralgia and neuritis, unspecified: Secondary | ICD-10-CM | POA: Diagnosis not present

## 2019-06-10 DIAGNOSIS — M7661 Achilles tendinitis, right leg: Secondary | ICD-10-CM | POA: Diagnosis not present

## 2019-06-10 DIAGNOSIS — R2689 Other abnormalities of gait and mobility: Secondary | ICD-10-CM | POA: Diagnosis not present

## 2019-06-10 DIAGNOSIS — M722 Plantar fascial fibromatosis: Secondary | ICD-10-CM | POA: Diagnosis not present

## 2019-06-10 DIAGNOSIS — M7662 Achilles tendinitis, left leg: Secondary | ICD-10-CM | POA: Diagnosis not present

## 2019-07-31 DIAGNOSIS — N910 Primary amenorrhea: Secondary | ICD-10-CM | POA: Diagnosis not present

## 2019-07-31 DIAGNOSIS — N9489 Other specified conditions associated with female genital organs and menstrual cycle: Secondary | ICD-10-CM | POA: Diagnosis not present

## 2019-07-31 DIAGNOSIS — N898 Other specified noninflammatory disorders of vagina: Secondary | ICD-10-CM | POA: Diagnosis not present

## 2019-07-31 DIAGNOSIS — Z3202 Encounter for pregnancy test, result negative: Secondary | ICD-10-CM | POA: Diagnosis not present

## 2019-09-13 DIAGNOSIS — M62830 Muscle spasm of back: Secondary | ICD-10-CM | POA: Diagnosis not present

## 2019-11-15 DIAGNOSIS — M5416 Radiculopathy, lumbar region: Secondary | ICD-10-CM | POA: Diagnosis not present

## 2019-11-15 DIAGNOSIS — R1031 Right lower quadrant pain: Secondary | ICD-10-CM | POA: Diagnosis not present

## 2019-11-25 ENCOUNTER — Ambulatory Visit: Payer: BC Managed Care – PPO | Attending: Internal Medicine

## 2019-11-25 DIAGNOSIS — Z23 Encounter for immunization: Secondary | ICD-10-CM

## 2019-11-25 NOTE — Progress Notes (Signed)
   Covid-19 Vaccination Clinic  Name:  Mikayla Wiley    MRN: [id number] DOB: 06/17/1970  11/25/2019  Ms. Mikayla Wiley was observed post Covid-19 immunization for 15 minutes without incident. She was provided with Vaccine Information Sheet and instruction to access the V-Safe system.   Ms. Mikayla Wiley was instructed to call 911 with any severe reactions post vaccine: Marland Kitchen Difficulty breathing  . Swelling of face and throat  . A fast heartbeat  . A bad rash all over body  . Dizziness and weakness   Immunizations Administered    No immunizations on file.

## 2019-12-04 ENCOUNTER — Ambulatory Visit (INDEPENDENT_AMBULATORY_CARE_PROVIDER_SITE_OTHER): Payer: BC Managed Care – PPO | Admitting: Orthopedic Surgery

## 2019-12-04 ENCOUNTER — Ambulatory Visit: Payer: Self-pay

## 2019-12-04 ENCOUNTER — Other Ambulatory Visit: Payer: Self-pay

## 2019-12-04 ENCOUNTER — Encounter: Payer: Self-pay | Admitting: Orthopedic Surgery

## 2019-12-04 VITALS — Ht 67.0 in | Wt 188.0 lb

## 2019-12-04 DIAGNOSIS — M2351 Chronic instability of knee, right knee: Secondary | ICD-10-CM | POA: Diagnosis not present

## 2019-12-04 DIAGNOSIS — M25561 Pain in right knee: Secondary | ICD-10-CM | POA: Diagnosis not present

## 2019-12-04 DIAGNOSIS — G8929 Other chronic pain: Secondary | ICD-10-CM | POA: Diagnosis not present

## 2019-12-04 DIAGNOSIS — Z01419 Encounter for gynecological examination (general) (routine) without abnormal findings: Secondary | ICD-10-CM | POA: Diagnosis not present

## 2019-12-06 ENCOUNTER — Telehealth: Payer: Self-pay | Admitting: Orthopedic Surgery

## 2019-12-06 NOTE — Telephone Encounter (Signed)
Patient ready to proceed with right knee ACL procedure.  She would like to have the surgery at [facility].  She has chosen a date of Feb 17th at 12:15pm.   Please provide surgery sheet.  Thanks.

## 2019-12-08 ENCOUNTER — Encounter: Payer: Self-pay | Admitting: Orthopedic Surgery

## 2019-12-08 NOTE — Progress Notes (Signed)
Office Visit Note   Patient: Mikayla Wiley           Date of Birth: 1970-09-26           MRN: [id number] Visit Date: 12/04/2019 Requested by: [facility] [address] [place],  [place] [address] PCP: [facility]  Subjective: Chief Complaint  Patient presents with  . Right Knee - Pain    HPI: Mikayla Wiley is a 49 year old patient with right knee pain.  She has been doing a lot of standing at work.  She is having a lot of swelling and pain as well as symptomatic instability twice a day.  She does banking.  Had an MRI scan of 2019 which showed ACL tear only.  No personal or family history of DVT or pulmonary embolism.  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  She has bilateral plantar fasciitis as well as bilateral Achilles tendinitis as well.  Taking ibuprofen with mild relief.              ROS: All systems reviewed are negative as they relate to the chief complaint within the history of present illness.  Patient denies  fevers or chills.   Assessment & Plan: Visit Diagnoses:  1. Chronic pain of right knee     Plan: Impression is right knee ACL instability with worsening of symptoms including pain and instability.  Hard to say if she has torn her meniscus in the past 2 years.  She would like to get ACL reconstruction performed which I think would be reasonable for her at this time.  Risk benefits of ACL reconstruction including but not limited to infection nerve vessel damage stiffness persistent instability as well as inability to return to functional sporting activities are all discussed.  The extensive nature of the rehabilitative process also discussed.  All questions answered.  Follow-Up Instructions: Return if symptoms worsen or fail to improve.   Orders:  Orders Placed This Encounter  Procedures  . XR KNEE 3 VIEW  RIGHT   No orders of the defined types were placed in this encounter.     Procedures: No procedures performed   Clinical Data: No additional findings.  Objective: Vital Signs: Ht 5\' 7"  (1.702 m)   Wt 188 lb (85.3 kg)   BMI 29.44 kg/m   Physical Exam:   Constitutional: Patient appears well-developed HEENT:  Head: Normocephalic Eyes:EOM are normal Neck: Normal range of motion Cardiovascular: Normal rate Pulmonary/chest: Effort normal Neurologic: Patient is alert Skin: Skin is warm Psychiatric: Patient has normal mood and affect    Ortho Exam: Ortho exam demonstrates full extension and full flexion of the right knee.  No effusion is present.  No focal joint line tenderness is present.  No posterior lateral rotatory instability is present.  ACL laxity is present with good stability to varus valgus stress at 0 and 30 degrees.  Negative McMurray compression testing bilaterally.  Specialty Comments:  No specialty comments available.  Imaging: No results found.   PMFS History: Patient Active Problem List   Diagnosis Date Noted  . S/P cervical spinal fusion 01/31/2014   Past Medical History:  Diagnosis Date  . Headache    migrains    Family History  Problem Relation Age of Onset  . Healthy Mother   . Healthy Father     Past  Surgical History:  Procedure Laterality Date  . ANTERIOR CERVICAL DECOMP/DISCECTOMY FUSION N/A 01/31/2014   Procedure: C5-6 Anterior Cervical Discectomy and Fusion;  Surgeon: Eldred Manges, MD;  Location: [facility];  Service: Orthopedics;  Laterality: N/A;  . CESAREAN SECTION    . KNEE ARTHROSCOPY Right 12/91   Social History   Occupational History  . Not on file  Tobacco Use  . Smoking status: Never Smoker  . Smokeless tobacco: Never Used  Substance and Sexual Activity  . Alcohol use: No  . Drug use: No  . Sexual activity: Not on file

## 2019-12-09 NOTE — Telephone Encounter (Signed)
See below. Please provide sheet.

## 2019-12-11 NOTE — Telephone Encounter (Signed)
Done thx

## 2020-02-01 ENCOUNTER — Encounter (HOSPITAL_COMMUNITY): Payer: Self-pay | Admitting: Emergency Medicine

## 2020-02-01 ENCOUNTER — Ambulatory Visit (HOSPITAL_COMMUNITY)
Admission: EM | Admit: 2020-02-01 | Discharge: 2020-02-01 | Disposition: A | Discharge status: Home / Self Care | Payer: BC Managed Care – PPO | Attending: Medical Oncology | Admitting: Medical Oncology

## 2020-02-01 ENCOUNTER — Other Ambulatory Visit: Payer: Self-pay

## 2020-02-01 DIAGNOSIS — U071 COVID-19: Secondary | ICD-10-CM | POA: Diagnosis not present

## 2020-02-01 DIAGNOSIS — Z8616 Personal history of COVID-19: Secondary | ICD-10-CM | POA: Insufficient documentation

## 2020-02-01 DIAGNOSIS — R0981 Nasal congestion: Secondary | ICD-10-CM | POA: Insufficient documentation

## 2020-02-01 DIAGNOSIS — J069 Acute upper respiratory infection, unspecified: Secondary | ICD-10-CM

## 2020-02-01 DIAGNOSIS — R059 Cough, unspecified: Secondary | ICD-10-CM | POA: Insufficient documentation

## 2020-02-01 LAB — SARS CORONAVIRUS 2 (TAT 6-24 HRS): SARS Coronavirus 2: POSITIVE — AB

## 2020-02-01 MED ORDER — BENZONATATE 100 MG PO CAPS: 100.0000 mg | ORAL_CAPSULE | Freq: Three times a day (TID) | ORAL | 0 refills | Status: DC

## 2020-02-01 MED ORDER — ALBUTEROL SULFATE HFA 108 (90 BASE) MCG/ACT IN AERS: 1.0000 | INHALATION_SPRAY | Freq: Four times a day (QID) | RESPIRATORY_TRACT | 0 refills | Status: DC | PRN

## 2020-02-01 MED ORDER — FLUTICASONE PROPIONATE 50 MCG/ACT NA SUSP: 2.0000 | Freq: Every day | NASAL | 0 refills | Status: DC

## 2020-02-01 MED ORDER — ALBUTEROL SULFATE HFA 108 (90 BASE) MCG/ACT IN AERS: 1.0000 | INHALATION_SPRAY | Freq: Four times a day (QID) | RESPIRATORY_TRACT | 0 refills | Status: AC | PRN

## 2020-02-01 NOTE — ED Triage Notes (Signed)
Pt presents today with cough and nasal congestion x 5 days. Denies fever.

## 2020-02-01 NOTE — ED Provider Notes (Signed)
[facility]    CSN: [id number] Arrival date & time: 02/01/20  1002      History   Chief Complaint Chief Complaint  Patient presents with  . Cough  . Nasal Congestion    HPI Mikayla Wiley is a 50 y.o. female.   HPI   Cough: Patient presents with a 5-day history of cough and nasal congestion.  She reports that the cough is mostly dry in nature.  Cough and nasal congestion inhibit her from sleeping well at night.  She has tried reducing the heat in her bedroom to see if this would help.  She had one episode of 100 F temperature on Tuesday but has not had a reoccurrence since.  She denies any chest pain, shortness of breath, calf pain, loss of taste or smell, nausea, loss of appetite or diarrhea.  She has had 2 coworkers out with COVID-19.  She has received COVID-19 boostering.   Past Medical History:  Diagnosis Date  . COVID-19 12/2018  . Headache    migrains    Patient Active Problem List   Diagnosis Date Noted  . S/P cervical spinal fusion 01/31/2014    Past Surgical History:  Procedure Laterality Date  . ANTERIOR CERVICAL DECOMP/DISCECTOMY FUSION N/A 01/31/2014   Procedure: C5-6 Anterior Cervical Discectomy and Fusion;  Surgeon: Eldred Manges, MD;  Location: [facility];  Service: Orthopedics;  Laterality: N/A;  . CESAREAN SECTION    . KNEE ARTHROSCOPY Right 12/91    OB History   No obstetric history on file.      Home Medications    Prior to Admission medications   Medication Sig Start Date End Date Taking? Authorizing Provider  cyclobenzaprine (FLEXERIL) 10 MG tablet Take 1 tablet (10 mg total) by mouth 3 (three) times daily as needed for muscle spasms. 11/08/17   Lawyer, Cristal Deer, PA-C  HYDROcodone-acetaminophen (NORCO/VICODIN) 5-325 MG tablet Take 1 tablet by mouth every 6 (six) hours as needed for moderate pain. Patient not taking: Reported on 12/04/2019 11/08/17   Charlestine Night, PA-C  ibuprofen (ADVIL,MOTRIN) 800 MG tablet Take 1 tablet (800  mg total) by mouth 3 (three) times daily. 04/27/17   Wieters, Hallie C, PA-C  ondansetron (ZOFRAN ODT) 4 MG disintegrating tablet Take 1 tablet (4 mg total) by mouth every 8 (eight) hours as needed. Patient not taking: Reported on 11/08/2017 01/31/14   Naida Sleight, PA-C  predniSONE (DELTASONE) 50 MG tablet Take 1 tablet (50 mg total) by mouth daily with breakfast. 11/08/17   Charlestine Night, PA-C    Family History Family History  Problem Relation Age of Onset  . Healthy Mother   . Healthy Father     Social History Social History   Tobacco Use  . Smoking status: Never Smoker  . Smokeless tobacco: Never Used  Substance Use Topics  . Alcohol use: No  . Drug use: No     Allergies   Patient has no known allergies.   Review of Systems Review of Systems   Physical Exam Triage Vital Signs ED Triage Vitals  Enc Vitals Group     BP 02/01/20 1017 (!) 139/93     Pulse Rate 02/01/20 1017 89     Resp 02/01/20 1017 18     Temp 02/01/20 1017 98.5 F (36.9 C)     Temp Source 02/01/20 1017 Oral     SpO2 02/01/20 1017 97 %     Weight 02/01/20 1019 190 lb (86.2 kg)  Height 02/01/20 1019 5\' 7"  (1.702 m)     Head Circumference --      Peak Flow --      Pain Score 02/01/20 1018 7     Pain Loc --      Pain Edu? --      Excl. in GC? --    No data found.  Updated Vital Signs BP (!) 139/93 (BP Location: Right Arm)   Pulse 89   Temp 98.5 F (36.9 C) (Oral)   Resp 18   Ht 5\' 7"  (1.702 m)   Wt 190 lb (86.2 kg)   LMP 10/04/2019   SpO2 97%   BMI 29.76 kg/m   Physical Exam Vitals and nursing note reviewed.  Constitutional:      General: She is not in acute distress.    Appearance: Normal appearance. She is not ill-appearing, toxic-appearing or diaphoretic.  HENT:     Head: Normocephalic.     Right Ear: Tympanic membrane normal. There is no impacted cerumen.     Left Ear: Tympanic membrane normal. There is no impacted cerumen.     Nose: Congestion present.      Mouth/Throat:     Mouth: Mucous membranes are moist.     Pharynx: No oropharyngeal exudate or posterior oropharyngeal erythema.  Eyes:     Conjunctiva/sclera: Conjunctivae normal.     Pupils: Pupils are equal, round, and reactive to light.  Cardiovascular:     Rate and Rhythm: Normal rate and regular rhythm.     Pulses: Normal pulses.     Heart sounds: Normal heart sounds. No murmur heard. No gallop.   Pulmonary:     Effort: No respiratory distress.     Breath sounds: Normal breath sounds. No stridor. No wheezing, rhonchi or rales.  Chest:     Chest wall: No tenderness.  Musculoskeletal:     Cervical back: Normal range of motion and neck supple.  Lymphadenopathy:     Cervical: No cervical adenopathy.  Neurological:     Mental Status: She is alert.      UC Treatments / Results  Labs (all labs ordered are listed, but only abnormal results are displayed) Labs Reviewed - No data to display   Radiology No results found.  Procedures Procedures (including critical care time)  Medications Ordered in UC Medications - No data to display  Initial Impression / Assessment and Plan / UC Course  I have reviewed the triage vital signs and the nursing notes.  Pertinent labs & imaging results that were available during my care of the patient were reviewed by me and considered in my medical decision making (see chart for details).     New. COVID-19 test pending and discussed with patient.  COVID-19 precautions and symptom monitoring discussed.  Treating with Tessalon, Flonase and albuterol inhaler.  We discussed how to use these medications along with common potential side effects of precautions.  We discussed red flag symptoms that would warrant further follow-up  Final Clinical Impressions(s) / UC Diagnoses   Final diagnoses:  None   Discharge Instructions   None    ED Prescriptions    None     PDMP not reviewed this encounter.   , 12/04/2019 02/01/20  1048

## 2020-02-17 ENCOUNTER — Other Ambulatory Visit (HOSPITAL_COMMUNITY): Payer: BC Managed Care – PPO

## 2020-02-17 ENCOUNTER — Encounter (HOSPITAL_COMMUNITY)
Admission: RE | Admit: 2020-02-17 | Discharge: 2020-02-17 | Disposition: A | Discharge status: Home / Self Care | Payer: BC Managed Care – PPO | Source: Ambulatory Visit | Attending: Internal Medicine | Admitting: Internal Medicine

## 2020-02-17 ENCOUNTER — Other Ambulatory Visit: Payer: Self-pay

## 2020-02-17 ENCOUNTER — Encounter (HOSPITAL_COMMUNITY): Payer: Self-pay

## 2020-02-17 DIAGNOSIS — Z981 Arthrodesis status: Secondary | ICD-10-CM | POA: Diagnosis not present

## 2020-02-17 DIAGNOSIS — Z01812 Encounter for preprocedural laboratory examination: Secondary | ICD-10-CM | POA: Insufficient documentation

## 2020-02-17 DIAGNOSIS — Z8616 Personal history of COVID-19: Secondary | ICD-10-CM | POA: Diagnosis not present

## 2020-02-17 DIAGNOSIS — Z791 Long term (current) use of non-steroidal anti-inflammatories (NSAID): Secondary | ICD-10-CM | POA: Diagnosis not present

## 2020-02-17 DIAGNOSIS — S83511A Sprain of anterior cruciate ligament of right knee, initial encounter: Secondary | ICD-10-CM | POA: Diagnosis not present

## 2020-02-17 DIAGNOSIS — X58XXXA Exposure to other specified factors, initial encounter: Secondary | ICD-10-CM | POA: Diagnosis not present

## 2020-02-17 DIAGNOSIS — S83281A Other tear of lateral meniscus, current injury, right knee, initial encounter: Secondary | ICD-10-CM | POA: Diagnosis not present

## 2020-02-17 LAB — CBC
HCT: 40.1 % (ref 36.0–46.0)
Hemoglobin: 12.9 g/dL (ref 12.0–15.0)
MCH: 29.9 pg (ref 26.0–34.0)
MCHC: 32.2 g/dL (ref 30.0–36.0)
MCV: 93 fL (ref 80.0–100.0)
Platelets: 225 10*3/uL (ref 150–400)
RBC: 4.31 MIL/uL (ref 3.87–5.11)
RDW: 13.1 % (ref 11.5–15.5)
WBC: 5.1 10*3/uL (ref 4.0–10.5)
nRBC: 0 % (ref 0.0–0.2)

## 2020-02-17 LAB — BASIC METABOLIC PANEL
Anion gap: 8 (ref 5–15)
BUN: 9 mg/dL (ref 6–20)
CO2: 24 mmol/L (ref 22–32)
Calcium: 8.9 mg/dL (ref 8.9–10.3)
Chloride: 107 mmol/L (ref 98–111)
Creatinine, Ser: 1.08 mg/dL — ABNORMAL HIGH (ref 0.44–1.00)
GFR, Estimated: >60 mL/min (ref >60)
Glucose, Bld: 96 mg/dL (ref 70–99)
Potassium: 4 mmol/L (ref 3.5–5.1)
Sodium: 139 mmol/L (ref 135–145)

## 2020-02-17 LAB — SURGICAL PCR SCREEN
MRSA, PCR: NEGATIVE
Staphylococcus aureus: NEGATIVE

## 2020-02-17 NOTE — Progress Notes (Signed)
PCP: Dr. Clarene Duke at [facility] Cardiologist:  N/A  EKG:  01/21/14 CXR:  01/21/14 ECHO:  Denies Stress Test:  Denies Cardiac Cath:  Denies  Fasting Blood Sugar-  N/A Checks Blood Sugar_N/A__ times a day  OSA/CPAP:  No  ASA/Blood Thinners:  No  Covid test positive 02/01/20.  No need to retest  Anesthesia Review:  No  Patient denies shortness of breath, fever, cough, and chest pain at PAT appointment.  Patient verbalized understanding of instructions provided today at the PAT appointment.  Patient asked to review instructions at home and day of surgery.

## 2020-02-17 NOTE — Progress Notes (Signed)
Surgical Instructions    Your procedure is scheduled on 02/20/20.  Report to [facility] Main Entrance "A" at 10:15 A.M., then check in with the Admitting office.  Call this number if you have problems the morning of surgery:  [phone]   If you have any questions prior to your surgery date call [phone]: Open Monday-Friday 8am-4pm    Remember:  Do not eat after midnight the night before your surgery  You may drink clear liquids until 9:15 the morning of your surgery.   Clear liquids allowed are: Water, Non-Citrus Juices (without pulp), Carbonated Beverages, Clear Tea, Black Coffee Only, and Gatorade  Please complete your PRE-SURGERY ENSURE that was provided to you by 9:15 the morning of surgery.  Please, if able, drink it in one setting. DO NOT SIP.    Take these medicines the morning of surgery with A SIP OF WATER: albuterol (VENTOLIN HFA)  As of today, STOP taking any Aspirin (unless otherwise instructed by your surgeon) Aleve, Naproxen, Ibuprofen, Motrin, Advil, Goody's, BC's, all herbal medications, fish oil, and all vitamins.                     Do not wear jewelry, make up, or nail polish            Do not wear lotions, powders, perfumes, or deodorant.            Do not shave 48 hours prior to surgery.             Do not bring valuables to the hospital.            [facility] is not responsible for any belongings or valuables.  Do NOT Smoke (Tobacco/Vaping) or drink Alcohol 24 hours prior to your procedure If you use a CPAP at night, you may bring all equipment for your overnight stay.   Contacts, glasses, dentures or bridgework may not be worn into surgery, please bring cases for these belongings   For patients admitted to the hospital, discharge time will be determined by your treatment team.   Patients discharged the day of surgery will not be allowed to drive home, and someone needs to stay with them for 24 hours.    Special instructions:   [place]-  Preparing For Surgery  Before surgery, you can play an important role. Because skin is not sterile, your skin needs to be as free of germs as possible. You can reduce the number of germs on your skin by washing with CHG (chlorahexidine gluconate) Soap before surgery.  CHG is an antiseptic cleaner which kills germs and bonds with the skin to continue killing germs even after washing.    Oral Hygiene is also important to reduce your risk of infection.  Remember - BRUSH YOUR TEETH THE MORNING OF SURGERY WITH YOUR REGULAR TOOTHPASTE  Please do not use if you have an allergy to CHG or antibacterial soaps. If your skin becomes reddened/irritated stop using the CHG.  Do not shave (including legs and underarms) for at least 48 hours prior to first CHG shower. It is OK to shave your face.  Please follow these instructions carefully.   1. Shower the NIGHT BEFORE SURGERY and the MORNING OF SURGERY  2. If you chose to wash your hair, wash your hair first as usual with your normal shampoo.  3. After you shampoo, rinse your hair and body thoroughly to remove the shampoo.  4. Wash Face and genitals (private parts) with your  normal soap.   5.  Shower the NIGHT BEFORE SURGERY and the MORNING OF SURGERY with CHG Soap.   6. Use CHG Soap as you would any other liquid soap. You can apply CHG directly to the skin and wash gently with a scrungie or a clean washcloth.   7. Apply the CHG Soap to your body ONLY FROM THE NECK DOWN.  Do not use on open wounds or open sores. Avoid contact with your eyes, ears, mouth and genitals (private parts). Wash Face and genitals (private parts)  with your normal soap.   8. Wash thoroughly, paying special attention to the area where your surgery will be performed.  9. Thoroughly rinse your body with warm water from the neck down.  10. DO NOT shower/wash with your normal soap after using and rinsing off the CHG Soap.  11. Pat yourself dry with a CLEAN TOWEL.  12. Wear CLEAN  PAJAMAS to bed the night before surgery  13. Place CLEAN SHEETS on your bed the night before your surgery  14. DO NOT SLEEP WITH PETS.   Day of Surgery: Wear Clean/Comfortable clothing the morning of surgery Do not apply any deodorants/lotions.   Remember to brush your teeth WITH YOUR REGULAR TOOTHPASTE.   Please read over the following fact sheets that you were given.

## 2020-02-19 ENCOUNTER — Other Ambulatory Visit: Payer: Self-pay

## 2020-02-20 ENCOUNTER — Encounter (HOSPITAL_COMMUNITY)
Admission: RE | Disposition: A | Discharge status: Home / Self Care | Payer: Self-pay | Source: Ambulatory Visit | Attending: Orthopedic Surgery

## 2020-02-20 ENCOUNTER — Ambulatory Visit (HOSPITAL_COMMUNITY): Payer: BC Managed Care – PPO

## 2020-02-20 ENCOUNTER — Ambulatory Visit (HOSPITAL_COMMUNITY): Payer: BC Managed Care – PPO | Admitting: Anesthesiology

## 2020-02-20 ENCOUNTER — Ambulatory Visit (HOSPITAL_COMMUNITY)
Admission: RE | Admit: 2020-02-20 | Discharge: 2020-02-20 | Disposition: A | Discharge status: Home / Self Care | Payer: BC Managed Care – PPO | Source: Ambulatory Visit | Attending: Orthopedic Surgery | Admitting: Orthopedic Surgery

## 2020-02-20 ENCOUNTER — Other Ambulatory Visit: Payer: Self-pay

## 2020-02-20 ENCOUNTER — Encounter (HOSPITAL_COMMUNITY): Payer: Self-pay | Admitting: Orthopedic Surgery

## 2020-02-20 ENCOUNTER — Ambulatory Visit (HOSPITAL_COMMUNITY): Payer: BC Managed Care – PPO | Admitting: Vascular Surgery

## 2020-02-20 DIAGNOSIS — X58XXXA Exposure to other specified factors, initial encounter: Secondary | ICD-10-CM | POA: Diagnosis not present

## 2020-02-20 DIAGNOSIS — S83261A Peripheral tear of lateral meniscus, current injury, right knee, initial encounter: Secondary | ICD-10-CM

## 2020-02-20 DIAGNOSIS — S83261D Peripheral tear of lateral meniscus, current injury, right knee, subsequent encounter: Secondary | ICD-10-CM

## 2020-02-20 DIAGNOSIS — G8918 Other acute postprocedural pain: Secondary | ICD-10-CM | POA: Diagnosis not present

## 2020-02-20 DIAGNOSIS — S83281A Other tear of lateral meniscus, current injury, right knee, initial encounter: Secondary | ICD-10-CM | POA: Insufficient documentation

## 2020-02-20 DIAGNOSIS — S83511A Sprain of anterior cruciate ligament of right knee, initial encounter: Secondary | ICD-10-CM

## 2020-02-20 DIAGNOSIS — S83511D Sprain of anterior cruciate ligament of right knee, subsequent encounter: Secondary | ICD-10-CM

## 2020-02-20 DIAGNOSIS — Z791 Long term (current) use of non-steroidal anti-inflammatories (NSAID): Secondary | ICD-10-CM | POA: Diagnosis not present

## 2020-02-20 DIAGNOSIS — Z981 Arthrodesis status: Secondary | ICD-10-CM | POA: Insufficient documentation

## 2020-02-20 DIAGNOSIS — Z8616 Personal history of COVID-19: Secondary | ICD-10-CM | POA: Insufficient documentation

## 2020-02-20 HISTORY — PX: ANTERIOR CRUCIATE LIGAMENT REPAIR: SHX115

## 2020-02-20 LAB — POCT PREGNANCY, URINE: Preg Test, Ur: NEGATIVE

## 2020-02-20 SURGERY — RECONSTRUCTION, KNEE, ACL
Anesthesia: General | Site: Knee | Laterality: Right

## 2020-02-20 MED ORDER — VANCOMYCIN HCL 1000 MG IV SOLR
INTRAVENOUS | Status: AC
  Filled 2020-02-20: qty 1000

## 2020-02-20 MED ORDER — BUPIVACAINE HCL (PF) 0.25 % IJ SOLN
INTRAMUSCULAR | Status: AC
  Filled 2020-02-20: qty 30

## 2020-02-20 MED ORDER — CLONIDINE HCL (ANALGESIA) 100 MCG/ML EP SOLN
EPIDURAL | Status: AC
  Filled 2020-02-20: qty 10

## 2020-02-20 MED ORDER — OXYCODONE-ACETAMINOPHEN 5-325 MG PO TABS: 1.0000 | ORAL_TABLET | ORAL | 0 refills | Status: DC | PRN

## 2020-02-20 MED ORDER — FENTANYL CITRATE (PF) 100 MCG/2ML IJ SOLN: 25.0000 ug | INTRAMUSCULAR | Status: DC | PRN

## 2020-02-20 MED ORDER — OXYCODONE HCL 5 MG/5ML PO SOLN: 5.0000 mg | Freq: Once | ORAL | Status: DC | PRN

## 2020-02-20 MED ORDER — FENTANYL CITRATE (PF) 100 MCG/2ML IJ SOLN: 50.0000 ug | Freq: Once | INTRAMUSCULAR | Status: AC

## 2020-02-20 MED ORDER — BUPIVACAINE-EPINEPHRINE (PF) 0.25% -1:200000 IJ SOLN
INTRAMUSCULAR | Status: DC | PRN
  Administered 2020-02-20: 10 mL via PERINEURAL

## 2020-02-20 MED ORDER — PROPOFOL 10 MG/ML IV BOLUS
INTRAVENOUS | Status: DC | PRN
  Administered 2020-02-20: 200 mg via INTRAVENOUS
  Administered 2020-02-20: 70 mg via INTRAVENOUS

## 2020-02-20 MED ORDER — SUCCINYLCHOLINE CHLORIDE 20 MG/ML IJ SOLN
INTRAMUSCULAR | Status: DC | PRN
  Administered 2020-02-20: 60 mg via INTRAVENOUS

## 2020-02-20 MED ORDER — LACTATED RINGERS IV SOLN: INTRAVENOUS | Status: DC | PRN

## 2020-02-20 MED ORDER — SODIUM CHLORIDE 0.9 % IR SOLN
Status: DC | PRN
  Administered 2020-02-20 (×2): 3000 mL

## 2020-02-20 MED ORDER — LIDOCAINE 2% (20 MG/ML) 5 ML SYRINGE
INTRAMUSCULAR | Status: DC | PRN
  Administered 2020-02-20: 30 mg via INTRAVENOUS

## 2020-02-20 MED ORDER — ORAL CARE MOUTH RINSE: 15.0000 mL | Freq: Once | OROMUCOSAL | Status: AC

## 2020-02-20 MED ORDER — MIDAZOLAM HCL 2 MG/2ML IJ SOLN: 1.0000 mg | Freq: Once | INTRAMUSCULAR | Status: AC

## 2020-02-20 MED ORDER — CHLORHEXIDINE GLUCONATE 0.12 % MT SOLN
15.0000 mL | Freq: Once | OROMUCOSAL | Status: AC
  Administered 2020-02-20: 15 mL via OROMUCOSAL
  Filled 2020-02-20: qty 15

## 2020-02-20 MED ORDER — POVIDONE-IODINE 10 % EX SWAB: 2.0000 "application " | Freq: Once | CUTANEOUS | Status: DC

## 2020-02-20 MED ORDER — POVIDONE-IODINE 7.5 % EX SOLN
Freq: Once | CUTANEOUS | Status: DC
  Filled 2020-02-20: qty 118

## 2020-02-20 MED ORDER — SUFENTANIL CITRATE 50 MCG/ML IV SOLN
INTRAVENOUS | Status: AC
  Filled 2020-02-20: qty 1

## 2020-02-20 MED ORDER — MIDAZOLAM HCL 2 MG/2ML IJ SOLN
INTRAMUSCULAR | Status: AC
  Filled 2020-02-20: qty 2

## 2020-02-20 MED ORDER — DEXAMETHASONE SODIUM PHOSPHATE 10 MG/ML IJ SOLN
INTRAMUSCULAR | Status: DC | PRN
  Administered 2020-02-20: 10 mg via INTRAVENOUS

## 2020-02-20 MED ORDER — OXYCODONE HCL 5 MG PO TABS: 5.0000 mg | ORAL_TABLET | Freq: Once | ORAL | Status: DC | PRN

## 2020-02-20 MED ORDER — 0.9 % SODIUM CHLORIDE (POUR BTL) OPTIME
TOPICAL | Status: DC | PRN
  Administered 2020-02-20: 1000 mL

## 2020-02-20 MED ORDER — FENTANYL CITRATE (PF) 250 MCG/5ML IJ SOLN
INTRAMUSCULAR | Status: AC
  Filled 2020-02-20: qty 5

## 2020-02-20 MED ORDER — MORPHINE SULFATE (PF) 4 MG/ML IV SOLN
INTRAVENOUS | Status: AC
  Filled 2020-02-20: qty 2

## 2020-02-20 MED ORDER — ONDANSETRON HCL 4 MG/2ML IJ SOLN: 4.0000 mg | Freq: Four times a day (QID) | INTRAMUSCULAR | Status: DC | PRN

## 2020-02-20 MED ORDER — BUPIVACAINE-EPINEPHRINE (PF) 0.25% -1:200000 IJ SOLN
INTRAMUSCULAR | Status: AC
  Filled 2020-02-20: qty 10

## 2020-02-20 MED ORDER — PROPOFOL 10 MG/ML IV BOLUS
INTRAVENOUS | Status: AC
  Filled 2020-02-20: qty 20

## 2020-02-20 MED ORDER — FENTANYL CITRATE (PF) 100 MCG/2ML IJ SOLN
INTRAMUSCULAR | Status: AC
  Administered 2020-02-20: 50 ug via INTRAVENOUS
  Filled 2020-02-20: qty 2

## 2020-02-20 MED ORDER — MIDAZOLAM HCL 2 MG/2ML IJ SOLN
INTRAMUSCULAR | Status: AC
  Administered 2020-02-20: 1 mg via INTRAVENOUS
  Filled 2020-02-20: qty 2

## 2020-02-20 MED ORDER — LACTATED RINGERS IV SOLN: INTRAVENOUS | Status: DC

## 2020-02-20 MED ORDER — VANCOMYCIN HCL 1000 MG IV SOLR
INTRAVENOUS | Status: DC | PRN
  Administered 2020-02-20: 1000 mg

## 2020-02-20 MED ORDER — BUPIVACAINE HCL (PF) 0.25 % IJ SOLN
INTRAMUSCULAR | Status: DC | PRN
  Administered 2020-02-20: 30 mL

## 2020-02-20 MED ORDER — CEFAZOLIN SODIUM-DEXTROSE 2-4 GM/100ML-% IV SOLN
2.0000 g | INTRAVENOUS | Status: AC
  Administered 2020-02-20: 2 g via INTRAVENOUS
  Filled 2020-02-20: qty 100

## 2020-02-20 MED ORDER — MORPHINE SULFATE (PF) 4 MG/ML IV SOLN
INTRAVENOUS | Status: DC | PRN
  Administered 2020-02-20: 8 mg via INTRAVENOUS

## 2020-02-20 MED ORDER — CLONIDINE HCL (ANALGESIA) 100 MCG/ML EP SOLN
EPIDURAL | Status: DC | PRN
  Administered 2020-02-20: 1 mL

## 2020-02-20 MED ORDER — FENTANYL CITRATE (PF) 100 MCG/2ML IJ SOLN
INTRAMUSCULAR | Status: DC | PRN
  Administered 2020-02-20 (×8): 25 ug via INTRAVENOUS

## 2020-02-20 MED ORDER — ONDANSETRON HCL 4 MG/2ML IJ SOLN
INTRAMUSCULAR | Status: DC | PRN
  Administered 2020-02-20: 4 mg via INTRAVENOUS

## 2020-02-20 MED ORDER — METHOCARBAMOL 500 MG PO TABS: 500.0000 mg | ORAL_TABLET | Freq: Three times a day (TID) | ORAL | 0 refills | Status: DC | PRN

## 2020-02-20 MED ORDER — BUPIVACAINE-EPINEPHRINE (PF) 0.5% -1:200000 IJ SOLN
INTRAMUSCULAR | Status: DC | PRN
  Administered 2020-02-20: 20 mL via PERINEURAL

## 2020-02-20 MED ORDER — ASPIRIN 81 MG PO CHEW: 81.0000 mg | CHEWABLE_TABLET | Freq: Every day | ORAL | 0 refills | Status: AC

## 2020-02-20 SURGICAL SUPPLY — 89 items
ANCH SUT SWLK 19.1X4.75 VT (Anchor) ×3 IMPLANT
ANCHOR PEEK 4.75X19.1 SWLK C (Anchor) ×6 IMPLANT
ANCHOR TIGHTROPE II 20 (Anchor) ×2 IMPLANT
BANDAGE ESMARK 6X9 LF (GAUZE/BANDAGES/DRESSINGS) ×1 IMPLANT
BLADE EXCALIBUR 4.0X13 (MISCELLANEOUS) ×2 IMPLANT
BLADE SURG 10 STRL SS (BLADE) ×2 IMPLANT
BLADE SURG 15 STRL LF DISP TIS (BLADE) ×2 IMPLANT
BLADE SURG 15 STRL SS (BLADE) ×4
BNDG CMPR 9X6 STRL LF SNTH (GAUZE/BANDAGES/DRESSINGS) ×1
BNDG CMPR MED 10X6 ELC LF (GAUZE/BANDAGES/DRESSINGS) ×2
BNDG CMPR MED 15X6 ELC VLCR LF (GAUZE/BANDAGES/DRESSINGS) ×1
BNDG ELASTIC 4X5.8 VLCR STR LF (GAUZE/BANDAGES/DRESSINGS) ×2 IMPLANT
BNDG ELASTIC 6X10 VLCR STRL LF (GAUZE/BANDAGES/DRESSINGS) ×4 IMPLANT
BNDG ELASTIC 6X15 VLCR STRL LF (GAUZE/BANDAGES/DRESSINGS) ×2 IMPLANT
BNDG ESMARK 6X9 LF (GAUZE/BANDAGES/DRESSINGS) ×2
BURR OVAL 8 FLU 4.0X13 (MISCELLANEOUS) ×2 IMPLANT
CANNULA 5.75X71 LONG (CANNULA) ×2 IMPLANT
CLOSURE STERI-STRIP 1/4X4 (GAUZE/BANDAGES/DRESSINGS) ×2 IMPLANT
COVER SURGICAL LIGHT HANDLE (MISCELLANEOUS) ×2 IMPLANT
CUFF TOURN SGL QUICK 34 (TOURNIQUET CUFF) ×2
CUFF TRNQT CYL 34X4.125X (TOURNIQUET CUFF) ×1 IMPLANT
CUTTER BONE 4.0MM X 13CM (MISCELLANEOUS) ×4 IMPLANT
DECANTER SPIKE VIAL GLASS SM (MISCELLANEOUS) ×2 IMPLANT
DRAPE ARTHROSCOPY W/POUCH 114 (DRAPES) ×2 IMPLANT
DRAPE INCISE IOBAN 66X45 STRL (DRAPES) ×2 IMPLANT
DRAPE OEC MINIVIEW 54X84 (DRAPES) ×2 IMPLANT
DRAPE ORTHO SPLIT 77X108 STRL (DRAPES) ×2
DRAPE SURG ORHT 6 SPLT 77X108 (DRAPES) ×1 IMPLANT
DRAPE U-SHAPE 47X51 STRL (DRAPES) ×2 IMPLANT
DRILL FLIPCUTTER III TGHTRP RT (Anchor) ×1 IMPLANT
DRSG PAD ABDOMINAL 8X10 ST (GAUZE/BANDAGES/DRESSINGS) ×6 IMPLANT
DW OUTFLOW CASSETTE/TUBE SET (MISCELLANEOUS) ×2 IMPLANT
ELECT REM PT RETURN 9FT ADLT (ELECTROSURGICAL) ×2
ELECTRODE REM PT RTRN 9FT ADLT (ELECTROSURGICAL) ×1 IMPLANT
FIBERLOOP 2 0 (SUTURE) ×2 IMPLANT
FLIPCUTTER III TIGHTROPE RT (Anchor) ×2 IMPLANT
GAUZE SPONGE 4X4 12PLY STRL (GAUZE/BANDAGES/DRESSINGS) ×2 IMPLANT
GAUZE XEROFORM 1X8 LF (GAUZE/BANDAGES/DRESSINGS) ×2 IMPLANT
GLOVE BIO SURGEON STRL SZ7 (GLOVE) ×4 IMPLANT
GLOVE ECLIPSE 8.0 STRL XLNG CF (GLOVE) ×2 IMPLANT
GLOVE INDICATOR 7.0 STRL GRN (GLOVE) ×2 IMPLANT
GLOVE SRG 8 PF TXTR STRL LF DI (GLOVE) ×1 IMPLANT
GLOVE SURG UNDER POLY LF SZ8 (GLOVE) ×2
GOWN STRL REUS W/ TWL LRG LVL3 (GOWN DISPOSABLE) ×3 IMPLANT
GOWN STRL REUS W/ TWL XL LVL3 (GOWN DISPOSABLE) ×1 IMPLANT
GOWN STRL REUS W/TWL LRG LVL3 (GOWN DISPOSABLE) ×6
GOWN STRL REUS W/TWL XL LVL3 (GOWN DISPOSABLE) ×2
KIT BASIN OR (CUSTOM PROCEDURE TRAY) ×2 IMPLANT
KIT BIOCARTILAGE LG JOINT MIX (KITS) ×2 IMPLANT
KIT BUTTON TIGHTROPE ABS 8X12 (Anchor) ×2 IMPLANT
KIT TRANSTIBIAL (DISPOSABLE) ×2 IMPLANT
KIT TURNOVER KIT B (KITS) ×2 IMPLANT
MANIFOLD NEPTUNE II (INSTRUMENTS) ×2 IMPLANT
NEEDLE 18GX1X1/2 (RX/OR ONLY) (NEEDLE) ×2 IMPLANT
NEEDLE SUT 2-0 SCORPION KNEE (NEEDLE) ×2 IMPLANT
NS IRRIG 1000ML POUR BTL (IV SOLUTION) ×2 IMPLANT
PACK ARTHROSCOPY DSU (CUSTOM PROCEDURE TRAY) ×2 IMPLANT
PAD ABD 8X10 STRL (GAUZE/BANDAGES/DRESSINGS) ×2 IMPLANT
PAD ARMBOARD 7.5X6 YLW CONV (MISCELLANEOUS) ×4 IMPLANT
PAD CAST 4YDX4 CTTN HI CHSV (CAST SUPPLIES) ×1 IMPLANT
PADDING CAST ABS 6INX4YD NS (CAST SUPPLIES) ×1
PADDING CAST ABS COTTON 6X4 NS (CAST SUPPLIES) ×1 IMPLANT
PADDING CAST COTTON 2X4 NS (CAST SUPPLIES) ×2 IMPLANT
PADDING CAST COTTON 4X4 STRL (CAST SUPPLIES) ×2
PADDING CAST COTTON 6X4 STRL (CAST SUPPLIES) ×6 IMPLANT
PENCIL BUTTON HOLSTER BLD 10FT (ELECTRODE) ×2 IMPLANT
PUTTY DBM ALLOSYNC PURE 5CC (Putty) ×2 IMPLANT
SPONGE LAP 4X18 RFD (DISPOSABLE) ×4 IMPLANT
SUCTION FRAZIER HANDLE 10FR (MISCELLANEOUS) ×2
SUCTION TUBE FRAZIER 10FR DISP (MISCELLANEOUS) ×1 IMPLANT
SUT 2 FIBERLOOP 20 STRT BLUE (SUTURE) ×6
SUT ETHILON 3 0 PS 1 (SUTURE) ×2 IMPLANT
SUT MNCRL AB 4-0 PS2 18 (SUTURE) ×4 IMPLANT
SUT VIC AB 0 CT1 27 (SUTURE) ×4
SUT VIC AB 0 CT1 27XBRD ANBCTR (SUTURE) ×2 IMPLANT
SUT VIC AB 2-0 CT1 27 (SUTURE) ×6
SUT VIC AB 2-0 CT1 TAPERPNT 27 (SUTURE) ×3 IMPLANT
SUT VICRYL 0 UR6 27IN ABS (SUTURE) ×2 IMPLANT
SUTURE 2 FIBERLOOP 20 STRT BLU (SUTURE) ×3 IMPLANT
SUTURE TAPE TIGERLINK 1.3MM BL (SUTURE) ×1 IMPLANT
SUTURETAPE TIGERLINK 1.3MM BL (SUTURE) ×2
SYR 30ML LL (SYRINGE) ×2 IMPLANT
SYR BULB IRRIG 60ML STRL (SYRINGE) ×2 IMPLANT
SYR TB 1ML LUER SLIP (SYRINGE) ×2 IMPLANT
TOWEL GREEN STERILE (TOWEL DISPOSABLE) ×4 IMPLANT
TOWEL GREEN STERILE FF (TOWEL DISPOSABLE) ×2 IMPLANT
TUBING ARTHROSCOPY IRRIG 16FT (MISCELLANEOUS) ×2 IMPLANT
UNDERPAD 30X36 HEAVY ABSORB (UNDERPADS AND DIAPERS) ×2 IMPLANT
WATER STERILE IRR 1000ML POUR (IV SOLUTION) ×2 IMPLANT

## 2020-02-20 NOTE — Brief Op Note (Signed)
   02/20/2020  3:43 PM  PATIENT:  Mikayla Wiley  50 y.o. female  PRE-OPERATIVE DIAGNOSIS:  RIGHT KNEE ANTERIOR CRUCIATE LIGAMENT TEAR  POST-OPERATIVE DIAGNOSIS:  RIGHT KNEE ANTERIOR CRUCIATE LIGAMENT TEAR lateral meniscal tear  PROCEDURE:  Procedure(s): RIGHT KNEE ANTERIOR CRUCIATE LIGAMENT (ACL) RECONSTRUCTION HAMSTRING AUTOGRAFT lateral meniscal repair  SURGEON:  Surgeon(s): August Saucer, Corrie Mckusick, MD  ASSISTANT: Karenann Cai, PA  ANESTHESIA:   general  EBL: 25 ml    Total I/O In: 1500 [I.V.:1500] Out: -   BLOOD ADMINISTERED: none  DRAINS: none   LOCAL MEDICATIONS USED: Marcaine morphine clonidine  SPECIMEN:  No Specimen  COUNTS:  YES  TOURNIQUET:  * Missing tourniquet times found for documented tourniquets in log: [id number] *  DICTATION: .Other Dictation: Dictation Number [id number]  PLAN OF CARE: Discharge to home after PACU  PATIENT DISPOSITION:  PACU - hemodynamically stable

## 2020-02-20 NOTE — Progress Notes (Signed)
Orthopedic Tech Progress Note Patient Details:  TWILA RAPPA 1970/06/21 [id number]  Ortho Devices Type of Ortho Device: Crutches Ortho Device/Splint Interventions: Ordered,Adjustment   Post Interventions Patient Tolerated: Fair Instructions Provided: Adjustment of device,Care of device,Poper ambulation with device   Michelle Piper 02/20/2020, 5:27 PM

## 2020-02-20 NOTE — Anesthesia Procedure Notes (Signed)
Anesthesia Regional Block: Adductor canal block   Pre-Anesthetic Checklist: ,, timeout performed, Correct Patient, Correct Site, Correct Laterality, Correct Procedure, Correct Position, site marked, Risks and benefits discussed,  Surgical consent,  Pre-op evaluation,  At surgeon's request and post-op pain management  Laterality: Right  Prep: chloraprep       Needles:  Injection technique: Single-shot  Needle Type: Echogenic Needle     Needle Length: 9cm  Needle Gauge: 21     Additional Needles:   Narrative:  Start time: 02/20/2020 10:55 AM End time: 02/20/2020 11:04 AM Injection made incrementally with aspirations every 5 mL.  Performed by: Personally  Anesthesiologist: Achille Rich, MD  Additional Notes: Pt tolerated the procedure well.

## 2020-02-20 NOTE — H&P (Signed)
Mikayla Wiley is an 50 y.o. female.   Chief Complaint: Right knee instability HPI:  Mikayla Wiley is a 50 year old patient with right knee pain.  She has been doing a lot of standing at work.  She is having a lot of swelling and pain as well as symptomatic instability twice a day.  She does banking.  Had an MRI scan of 2019 which showed ACL tear only.  No personal or family history of DVT or pulmonary embolism.  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  She has bilateral plantar fasciitis as well as bilateral Achilles tendinitis as well.  Taking ibuprofen with mild relief.  Past Medical History:  Diagnosis Date  . COVID-19 12/2018  . Headache    migrains    Past Surgical History:  Procedure Laterality Date  . ANTERIOR CERVICAL DECOMP/DISCECTOMY FUSION N/A 01/31/2014   Procedure: C5-6 Anterior Cervical Discectomy and Fusion;  Surgeon: Eldred Manges, MD;  Location: [facility] OR;  Service: Orthopedics;  Laterality: N/A;  . CESAREAN SECTION    . KNEE ARTHROSCOPY Right 12/91    Family History  Problem Relation Age of Onset  . Healthy Mother   . Healthy Father    Social History:  reports that she has never smoked. She has never used smokeless tobacco. She reports that she does not drink alcohol and does not use drugs.  Allergies: No Known Allergies  Medications Prior to Admission  Medication Sig Dispense Refill  . albuterol (VENTOLIN HFA) 108 (90 Base) MCG/ACT inhaler Inhale 1-2 puffs into the lungs every 6 (six) hours as needed for wheezing or shortness of breath. 18 each 0  . ibuprofen (ADVIL,MOTRIN) 800 MG tablet Take 1 tablet (800 mg total) by mouth 3 (three) times daily. (Patient taking differently: Take 800 mg by mouth daily as needed for mild pain or moderate pain.) 21 tablet 0  . benzonatate (TESSALON) 100 MG capsule Take 1 capsule (100 mg total) by mouth every 8 (eight) hours. (Patient not  taking: No sig reported) 21 capsule 0  . fluticasone (FLONASE) 50 MCG/ACT nasal spray Place 2 sprays into both nostrils daily. (Patient not taking: No sig reported) 16 mL 0    Results for orders placed or performed during the hospital encounter of 02/20/20 (from the past 48 hour(s))  Pregnancy, urine POC     Status: None   Collection Time: 02/20/20 10:43 AM  Result Value Ref Range   Preg Test, Ur NEGATIVE NEGATIVE    Comment:        THE SENSITIVITY OF THIS METHODOLOGY IS >24 mIU/mL    No results found.  Review of Systems  Musculoskeletal: Positive for arthralgias.  All other systems reviewed and are negative.   Blood pressure 115/62, pulse 70, temperature (!) 97.1 F (36.2 C), temperature source Oral, resp. rate (!) 22, height 5\' 7"  (1.702 m), weight 90.3 kg, last menstrual period 02/16/2020, SpO2 99 %. Physical Exam Vitals reviewed.  HENT:     Head: Normocephalic.     Nose: Nose normal.     Mouth/Throat:     Mouth: Mucous membranes are moist.  Eyes:     Pupils: Pupils are equal, round, and reactive to light.  Cardiovascular:     Rate and Rhythm: Normal rate.     Pulses: Normal pulses.  Pulmonary:     Effort: Pulmonary effort is normal.  Abdominal:  General: Abdomen is flat.  Musculoskeletal:     Cervical back: Normal range of motion.  Skin:    General: Skin is warm.     Capillary Refill: Capillary refill takes less than 2 seconds.  Neurological:     General: No focal deficit present.     Mental Status: She is alert.  Psychiatric:        Mood and Affect: Mood normal.      Ortho exam demonstrates full extension and full flexion of the right knee.  No effusion is present.  No focal joint line tenderness is present.  No posterior lateral rotatory instability is present.  ACL laxity is present with good stability to varus valgus stress at 0 and 30 degrees.  Negative McMurray compression testing bilaterally. Assessment/Plan  Impression is right knee ACL instability  with worsening of symptoms including pain and instability.  Hard to say if she has torn her meniscus in the past 2 years.  She would like to get ACL reconstruction performed which I think would be reasonable for her at this time.  Risk benefits of ACL reconstruction including but not limited to infection nerve vessel damage stiffness persistent instability as well as inability to return to functional sporting activities are all discussed.  The extensive nature of the rehabilitative process also discussed.  All questions answered  Burnard Bunting, MD 02/20/2020, 11:29 AM

## 2020-02-20 NOTE — Transfer of Care (Signed)
Immediate Anesthesia Transfer of Care Note  Patient: Mikayla Wiley  Procedure(s) Performed: RIGHT KNEE ANTERIOR CRUCIATE LIGAMENT (ACL) RECONSTRUCTION HAMSTRING AUTOGRAFT (Right Knee)  Patient Location: PACU  Anesthesia Type:General  Level of Consciousness: awake, alert  and oriented  Airway & Oxygen Therapy: Patient Spontanous Breathing and Patient connected to nasal cannula oxygen  Post-op Assessment: Report given to RN and Post -op Vital signs reviewed and stable  Post vital signs: Reviewed and stable  Last Vitals:  Vitals Value Taken Time  BP 125/92 02/20/20 1552  Temp 36.1 C 02/20/20 1552  Pulse 37 02/20/20 1553  Resp 16 02/20/20 1559  SpO2 89 % 02/20/20 1553  Vitals shown include unvalidated device data.  Last Pain:  Vitals:   02/20/20 1119  TempSrc:   PainSc: 0-No pain      Patients Stated Pain Goal: 4 (02/20/20 1030)  Complications: No complications documented.

## 2020-02-20 NOTE — Anesthesia Preprocedure Evaluation (Signed)
Anesthesia Evaluation  Patient identified by MRN, date of birth, ID band Patient awake    Reviewed: Allergy & Precautions, H&P , NPO status , Patient's Chart, lab work & pertinent test results  Airway Mallampati: II   Neck ROM: full    Dental   Pulmonary neg pulmonary ROS,    breath sounds clear to auscultation       Cardiovascular negative cardio ROS   Rhythm:regular Rate:Normal     Neuro/Psych  Headaches,    GI/Hepatic   Endo/Other    Renal/GU      Musculoskeletal   Abdominal   Peds  Hematology   Anesthesia Other Findings   Reproductive/Obstetrics                             Anesthesia Physical Anesthesia Plan  ASA: I  Anesthesia Plan: General   Post-op Pain Management:  Regional for Post-op pain   Induction: Intravenous  PONV Risk Score and Plan: 3 and Ondansetron, Dexamethasone, Midazolam and Treatment may vary due to age or medical condition  Airway Management Planned: LMA  Additional Equipment:   Intra-op Plan:   Post-operative Plan: Extubation in OR  Informed Consent: I have reviewed the patients History and Physical, chart, labs and discussed the procedure including the risks, benefits and alternatives for the proposed anesthesia with the patient or authorized representative who has indicated his/her understanding and acceptance.     Dental advisory given  Plan Discussed with: CRNA, Anesthesiologist and Surgeon  Anesthesia Plan Comments:         Anesthesia Quick Evaluation

## 2020-02-20 NOTE — Anesthesia Procedure Notes (Signed)
Procedure Name: LMA Insertion Date/Time: 02/20/2020 12:51 PM Performed by: Gwenyth Allegra, CRNA Pre-anesthesia Checklist: Emergency Drugs available, Suction available, Patient being monitored and Timeout performed Patient Re-evaluated:Patient Re-evaluated prior to induction Oxygen Delivery Method: Circle system utilized Preoxygenation: Pre-oxygenation with 100% oxygen Induction Type: IV induction Ventilation: Mask ventilation without difficulty LMA: LMA inserted LMA Size: 4.0 Grade View: Grade IV Number of attempts: 1 Placement Confirmation: positive ETCO2 and breath sounds checked- equal and bilateral Tube secured with: Tape Dental Injury: Teeth and Oropharynx as per pre-operative assessment  Difficulty Due To: Difficult Airway- due to limited oral opening Future Recommendations: Recommend- induction with short-acting agent, and alternative techniques readily available

## 2020-02-21 ENCOUNTER — Telehealth: Payer: Self-pay | Admitting: Orthopedic Surgery

## 2020-02-21 ENCOUNTER — Encounter (HOSPITAL_COMMUNITY): Payer: Self-pay | Admitting: Orthopedic Surgery

## 2020-02-21 DIAGNOSIS — M25561 Pain in right knee: Secondary | ICD-10-CM | POA: Diagnosis not present

## 2020-02-21 NOTE — Op Note (Signed)
NAME: Mikayla Wiley, Mikayla Wiley. MEDICAL RECORD [id number] ACCOUNT [id number] DATE OF BIRTH:[birth year] FACILITY: [facility] LOCATION: [facility]PER[facility] PHYSICIAN:Mikayla Jarnigan Diamantina Providence, MD  OPERATIVE REPORT  DATE OF PROCEDURE:  02/20/2020  PREOPERATIVE DIAGNOSIS:  Right knee anterior cruciate ligament tear.  POSTOPERATIVE DIAGNOSIS:  Right knee anterior cruciate ligament tear and lateral meniscal tear.  PROCEDURE:  Right knee anterior cruciate ligament reconstruction using hamstring autograft 9.5 mm graft with all-inside posterior horn of the lateral meniscus repair.  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  PROCEDURE IN DETAIL:  The patient was brought to the operating room where general anesthetic was induced.  Preoperative antibiotics were administered.  Timeout was called.  The patient was examined under anesthesia, was found to have about 15 degrees of  hyperextension of both knees.  Collaterals were stable to varus and valgus stress on the right at 0 and 30 degrees.  ACL laxity was present with positive Lachman, positive anterior drawer.  PCL was intact.  There was no posterolateral rotatory  instability.  Following examination under anesthesia, right leg was prescrubbed with alcohol and Betadine, allowed to air dry,  prepped with DuraPrep solution and draped in sterile manner.  Mikayla Wiley was used to cover the operative field.  Timeout was  called.  Portals were anesthetized using 5 mL of Marcaine with epinephrine.  Hamstring tendons were then harvested  after making a vertical incision just posterior and inferior to the tibial tubercle medially.  Skin and subcutaneous tissue were sharply  divided.  Semitendinosus tendon was then harvested with care being taken to avoid injury to the saphenous nerve.  Gracilis was also harvested in  order to make a graft of suitable size for this patient's size.  9.5 mm on the femur, 10 mm on the tibia.   This was prepared on the back table by [facility].  This was done using dual Endobutton technique.  Concurrently, anterior inferolateral and anterior inferomedial portals were established.  Diagnostic arthroscopy was performed.  Soft tissue was removed  from the notch region.  ACL was deficient.  Not really present.  Notchplasty performed.  Over the top position identified.  Medial meniscus was inspected and found to be intact.  Articular cartilage on the medial side intact.  Lateral compartment was  inspected and found to have intact articular cartilage, but there was a full-thickness vertical tear through the red-white junction beginning at the posterior horn of the lateral meniscus extending circumferentially, but not beyond the popliteus.  Became  less pronounced the closer we probed to the popliteus.  The interface between the 2 meniscal flaps medially on the lateral meniscus was prepped with a rasp.  Then, vertical mattress suture using the Scorpion was passed into that posterior aspect and a  very good repair was achieved using a sliding knot.  Next, the femoral tunnel was drilled in the 9 o'clock position, tibial tunnel drilled using the FlipCutter in the posterior aspect of the native ACL footprint.  Graft was passed.  Fluoroscopy was used  to confirm passage and flipping of the button.  20 mm in each tunnel achieved.  ____ extension with internal brace also present.  Supplemental fixation placed with an Arthrex SwiveLock.  Very good stability achieved.  Knee was taken through range of  motion, found to have excellent stability.  Thorough irrigation was performed.  The tunnels  were bone grafted prior to passage of the graft.  Next, thorough irrigation of the knee joint was performed.  Harvest site closed using 0 Vicryl suture, 2-0  Vicryl suture, and 3-0 Monocryl.  Portals closed using 2-0  Vicryl suture, 3-0 nylon.  Solution of Marcaine, morphine, clonidine injected into the knee for postop pain relief.  A bulky wrap and knee immobilizer placed along with ice packs.  Impervious  dressings also placed prior to that.  The patient tolerated the procedure well without immediate complications, transferred to the recovery room in stable condition.   Mikayla Wiley's assistance was required for graft preparation, limb positioning, mobilization of tissue.  His assistance was a medical necessity.  IN/NUANCE  D:02/20/2020 T:02/20/2020 JOB:[id number]

## 2020-02-21 NOTE — Telephone Encounter (Signed)
[employer] forms received. Sent to [employer] 

## 2020-02-23 DIAGNOSIS — S83261A Peripheral tear of lateral meniscus, current injury, right knee, initial encounter: Secondary | ICD-10-CM

## 2020-02-23 DIAGNOSIS — S83511A Sprain of anterior cruciate ligament of right knee, initial encounter: Secondary | ICD-10-CM

## 2020-02-24 NOTE — Anesthesia Postprocedure Evaluation (Signed)
Anesthesia Post Note  Patient: MARISAH LAKER  Procedure(s) Performed: RIGHT KNEE ANTERIOR CRUCIATE LIGAMENT (ACL) RECONSTRUCTION HAMSTRING AUTOGRAFT (Right Knee)     Patient location during evaluation: PACU Anesthesia Type: General and Regional Level of consciousness: awake and alert Pain management: pain level controlled Vital Signs Assessment: post-procedure vital signs reviewed and stable Respiratory status: spontaneous breathing, nonlabored ventilation, respiratory function stable and patient connected to nasal cannula oxygen Cardiovascular status: blood pressure returned to baseline and stable Postop Assessment: no apparent nausea or vomiting Anesthetic complications: no   No complications documented.  Last Vitals:  Vitals:   02/20/20 1620 02/20/20 1623  BP:  131/77  Pulse:  63  Resp: 10 16  Temp:  (!) 36.2 C  SpO2: 100% 100%    Last Pain:  Vitals:   02/20/20 1623  TempSrc:   PainSc: 0-No pain                 Meganne Rita S

## 2020-02-25 ENCOUNTER — Telehealth: Payer: Self-pay | Admitting: Orthopedic Surgery

## 2020-02-25 ENCOUNTER — Other Ambulatory Visit: Payer: Self-pay | Admitting: Orthopedic Surgery

## 2020-02-25 MED ORDER — OXYCODONE HCL 5 MG PO CAPS: 5.0000 mg | ORAL_CAPSULE | ORAL | 0 refills | Status: DC | PRN

## 2020-02-25 NOTE — Telephone Encounter (Signed)
Pt called stating she is going to need a refill on her oxycodone, she has 3 tablets left. She would like this sent in with an authorization if necessary and would like a CB when it's been sent.  [phone]

## 2020-02-25 NOTE — Telephone Encounter (Signed)
Pt.notified

## 2020-02-25 NOTE — Telephone Encounter (Signed)
Please advise 

## 2020-02-25 NOTE — Telephone Encounter (Signed)
Just did it

## 2020-02-26 ENCOUNTER — Telehealth: Payer: Self-pay

## 2020-02-26 NOTE — Telephone Encounter (Signed)
Patient called stating that she has removed the ace wrap from her right knee, but would like to know if she should keep the cotton on or remove it.  Cb# [phone].  Please advise.  Thank you.

## 2020-02-26 NOTE — Telephone Encounter (Signed)
IC advised ok to remove ace and cotton but to leave bandage intact until follow up

## 2020-02-28 ENCOUNTER — Inpatient Hospital Stay: Payer: BC Managed Care – PPO | Admitting: Orthopedic Surgery

## 2020-03-02 ENCOUNTER — Ambulatory Visit (INDEPENDENT_AMBULATORY_CARE_PROVIDER_SITE_OTHER): Payer: BC Managed Care – PPO | Admitting: Orthopedic Surgery

## 2020-03-02 ENCOUNTER — Other Ambulatory Visit: Payer: Self-pay

## 2020-03-02 DIAGNOSIS — Z9889 Other specified postprocedural states: Secondary | ICD-10-CM

## 2020-03-02 MED ORDER — OXYCODONE HCL 5 MG PO CAPS: 5.0000 mg | ORAL_CAPSULE | Freq: Four times a day (QID) | ORAL | 0 refills | Status: DC | PRN

## 2020-03-02 MED ORDER — MAGNESIUM CITRATE PO SOLN: 1.0000 | Freq: Once | ORAL | 0 refills | Status: AC

## 2020-03-02 MED ORDER — DOCUSATE SODIUM 100 MG PO CAPS: 100.0000 mg | ORAL_CAPSULE | Freq: Two times a day (BID) | ORAL | 0 refills | Status: DC | PRN

## 2020-03-03 ENCOUNTER — Encounter: Payer: Self-pay | Admitting: Physical Therapy

## 2020-03-03 ENCOUNTER — Ambulatory Visit (INDEPENDENT_AMBULATORY_CARE_PROVIDER_SITE_OTHER): Payer: BC Managed Care – PPO | Admitting: Physical Therapy

## 2020-03-03 DIAGNOSIS — M6281 Muscle weakness (generalized): Secondary | ICD-10-CM

## 2020-03-03 DIAGNOSIS — M25561 Pain in right knee: Secondary | ICD-10-CM

## 2020-03-03 DIAGNOSIS — R2689 Other abnormalities of gait and mobility: Secondary | ICD-10-CM | POA: Diagnosis not present

## 2020-03-03 DIAGNOSIS — M25661 Stiffness of right knee, not elsewhere classified: Secondary | ICD-10-CM

## 2020-03-03 DIAGNOSIS — R600 Localized edema: Secondary | ICD-10-CM

## 2020-03-03 NOTE — Therapy (Signed)
[facility] [address] [place], [place], [address] Phone: [phone]   Fax:  [phone]  Physical Therapy Evaluation  Patient Details  Name: Jeris Pentanita W Johnson MRN: [id number] Date of Birth: [birth year] Referring Provider (PT): Rise PaganiniGregory Dean, MD   Encounter Date: 03/03/2020   PT End of Session - 03/03/20 1510    Visit Number 1    Number of Visits 30    Date for PT Re-Evaluation 05/26/20    Progress Note Due on Visit 10    PT Start Time 1147    PT Stop Time 1229    PT Time Calculation (min) 42 min    Equipment Utilized During Treatment Gait belt    Activity Tolerance Patient tolerated treatment well;Patient limited by fatigue    Behavior During Therapy [facility] for tasks assessed/performed           Past Medical History:  Diagnosis Date  . COVID-19 12/2018  . Headache    migrains    Past Surgical History:  Procedure Laterality Date  . ANTERIOR CERVICAL DECOMP/DISCECTOMY FUSION N/A 01/31/2014   Procedure: C5-6 Anterior Cervical Discectomy and Fusion;  Surgeon: Eldred MangesMark C Yates, MD;  Location: [facility] OR;  Service: Orthopedics;  Laterality: N/A;  . ANTERIOR CRUCIATE LIGAMENT REPAIR Right 02/20/2020   Procedure: RIGHT KNEE ANTERIOR CRUCIATE LIGAMENT (ACL) RECONSTRUCTION HAMSTRING AUTOGRAFT;  Surgeon: Cammy Copaean, Gregory Scott, MD;  Location: [facility] OR;  Service: Orthopedics;  Laterality: Right;  . CESAREAN SECTION    . KNEE ARTHROSCOPY Right 12/91    There were no vitals filed for this visit.    Subjective Assessment - 03/03/20 1146    Subjective Patient is 12 days s/p Rt ACL reconstruction with hamstring graft. She is ambulating 50% PWB with crutches. She is out of her immobilizer brace and having daily pain about 6/7 but can get to at least 9 or 10 when bending her knee at night when switching from side sleping to her back. She works as a Haematologistbank teller with intermittent sitting and standing up to 3 hours at a time. She is managing her pain with medication and  heat.    Patient is accompained by: Family member   husband   Pertinent History cervical spinal fusion    Limitations Sitting;Standing;Walking;House hold activities    How long can you stand comfortably? amb with crutches 50% WB    Patient Stated Goals get back to work, be able to walk, take stairs, do daily activities    Currently in Pain? Yes    Pain Score 6     Pain Location Knee    Pain Orientation Right    Pain Descriptors / Indicators Aching    Pain Type Surgical pain    Pain Radiating Towards n?a    Pain Onset 1 to 4 weeks ago    Pain Frequency Constant    Aggravating Factors  bending knee    Pain Relieving Factors heat and ice, meds from dr.    Effect of Pain on Daily Activities unable to WB, walk, bend past 100 degrees              [facility] - 03/03/20 0001      Assessment   Medical Diagnosis Rt ACL reconstruction    Referring Provider (PT) Rise PaganiniGregory Dean, MD    Onset Date/Surgical Date 02/20/20    Next MD Visit 03/23/2020    Prior Therapy shoulder prior, CPM machine currently      Precautions   Precautions Knee  Required Braces or Orthoses --   no longer needs R knee immobilizer     Restrictions   Weight Bearing Restrictions Yes      Balance Screen   Has the patient fallen in the past 6 months No      Home Environment   Living Environment Private residence    Available Help at Discharge Family    Home Access Stairs to enter   1 flight of stairs to enter home   Entrance Stairs-Rails None      Prior Function   Level of Independence Independent    Vocation Full time employment   bank teller     Cognition   Overall Cognitive Status Within Functional Limits for tasks assessed      Observation/Other Assessments   Observations slight edema, incisions healing noramlly    Focus on Therapeutic Outcomes (FOTO)  functional intake score- 37%, predicted 60%      ROM / Strength   AROM / PROM / Strength PROM;Strength      PROM   PROM Assessment Site  Knee    Right/Left Knee Right    Right Knee Extension 0    Right Knee Flexion 100      Strength   Overall Strength Comments 5/5 on Lt side    Strength Assessment Site Knee    Right/Left Knee Right    Right Knee Flexion 3-/5    Right Knee Extension 3-/5      Palpation   Patella mobility --   assess next visit     Ambulation/Gait   Stairs Yes    Stairs Assistance 4: Min guard    Gait Comments ambulated with crutches gait training on stairs 8steps ascending and descending with instruction for guarding for her husband at home                      Objective measurements completed on examination: See above findings.               PT Education - 03/03/20 1531    Education Details HEP and POC, gait training for stairs    Person(s) Educated Patient;Spouse    Methods Explanation;Demonstration;Handout    Comprehension Verbalized understanding;Returned demonstration            PT Short Term Goals - 03/03/20 1522      PT SHORT TERM GOAL #1   Title Patient will be I with beginner HEP    Time 4    Period Weeks    Status New    Target Date 03/31/20      PT SHORT TERM GOAL #2   Title Patient will increase knee flexion by 10-15 degrees and maintain neutral extension    Baseline 0-100 PROM    Time 4    Period Weeks    Status New    Target Date 03/31/20             PT Long Term Goals - 03/03/20 1526      PT LONG TERM GOAL #1   Title Patient will dmeonstrate improved FOTO score to above 60%    Baseline 38% baseline    Time 12    Period Weeks    Status New    Target Date 05/26/20      PT LONG TERM GOAL #2   Title Patient will show AROM 0-125 for rt. knee for improved function    Baseline 0-100    Time 12    Period  Weeks    Status New    Target Date 05/26/20      PT LONG TERM GOAL #3   Title Patient will be able to ambulate without AD or supervision for at least 1 hour with minimal breaks for modified return to work    Baseline amb with  crutches 50% WB    Time 12    Period Weeks    Status New    Target Date 05/26/20      PT LONG TERM GOAL #4   Title Patient will demonstrate 4+or 5/5 for all RLE measuements for improved function of ADLs and leisure activities.    Baseline 3-/5    Time 12    Period Weeks    Status New    Target Date 05/26/20                  Plan - 03/03/20 1234    Clinical Impression Statement Patient presents s/p Rt ACL reconstruction on 2/17. She is doing relatively well, she is able to activate quad and tolerate SLR and LAQ with minimal difficulty with limited range. Deffered MMT due to surgery precuations but patient has obvious deficits and atrophy of LE musculature. She had concerns of her knee hyperextension during sleep. ROM is 0-100 passively in sitting. Patient denies any N/T or radiating symptoms. She is able to ambulate with crutches with supervision and was able to acsend and descend stairs with crutches and demonstrated good availability to do this to get into her home with supervision from her husband. Her incision is healing with tape still on, slight edema in Rt knee.    Personal Factors and Comorbidities Profession    Examination-Activity Limitations Sit;Stairs;Stand;Locomotion Level    Examination-Participation Restrictions Occupation;Laundry    Stability/Clinical Decision Making Stable/Uncomplicated    Clinical Decision Making Low    Rehab Potential Excellent    PT Frequency 3x / week    PT Duration 12 weeks    PT Treatment/Interventions ADLs/Self Care Home Management;Aquatic Therapy;Electrical Stimulation;Iontophoresis 4mg /ml Dexamethasone;Moist Heat;Ultrasound;Cryotherapy;Neuromuscular re-education;Balance training;Therapeutic exercise;Therapeutic activities;Functional mobility training;Stair training;Gait training;Patient/family education;Manual techniques;Dry needling;Passive range of motion;Scar mobilization;Taping;Vasopneumatic Device;Joint Manipulations    PT Next Visit  Plan 50% WB restriction currently. Continue quad strenghtening, assess ability for BFR, PROM/mobs for flexion    PT Home Exercise Plan Access Code:    Consulted and Agree with Plan of Care Patient;Family member/caregiver    Family Member Consulted Husband           Patient will benefit from skilled therapeutic intervention in order to improve the following deficits and impairments:  Abnormal gait,Decreased coordination,Decreased range of motion,Difficulty walking,Decreased activity tolerance,Pain,Decreased balance,Decreased mobility,Decreased strength,Increased edema  Visit Diagnosis: Acute pain of right knee  Stiffness of right knee, not elsewhere classified  Other abnormalities of gait and mobility  Localized edema     Problem List Patient Active Problem List   Diagnosis Date Noted  . Rupture of anterior cruciate ligament of right knee   . Peripheral tear of lateral meniscus of right knee as current injury   . S/P cervical spinal fusion 01/31/2014    02/02/2014, SPT 03/03/2020, 4:10 PM   During this treatment session, this physical therapist was present, participating in and directing the treatment.   This note has been reviewed and this clinician agrees with the information provided.  05/03/2020, PT, DPT 03/03/20 4:33 PM   [facility] [place] [facility] Physical Therapy [address] [place], [place], [place] Phone: [phone]   Fax:  [phone]  Name: RIPLEY LOVECCHIO MRN: [id number] Date of Birth: 25-Oct-1970

## 2020-03-03 NOTE — Patient Instructions (Signed)
Access Code: [email or web address] URL: [email or web address] Date: 03/03/2020 Prepared by: Ivery Quale  Exercises Seated Knee Flexion AAROM - 2 x daily - 6 x weekly - 1-2 sets - 10 reps - 5 hold Seated Long Arc Quad - 2 x daily - 6 x weekly - 1-2 sets - 10 reps Supine Knee Extension Strengthening - 2 x daily - 6 x weekly - 1-2 sets - 10 reps Supine Heel Slide with Strap - 2 x daily - 6 x weekly - 1-2 sets - 10 reps - 3 hold Modified Thomas Stretch - 2 x daily - 6 x weekly - 3 sets - 20-30 hold

## 2020-03-04 ENCOUNTER — Encounter: Payer: Self-pay | Admitting: Orthopedic Surgery

## 2020-03-04 NOTE — Progress Notes (Signed)
   Post-Op Visit Note   Patient: Mikayla Wiley           Date of Birth: [birth year]           MRN: [id number] Visit Date: 03/02/2020 PCP: [facility] [place]   Assessment & Plan:  Chief Complaint:  Chief Complaint  Patient presents with  . Right Knee - Routine Post Op   Visit Diagnoses:  1. S/P right knee arthroscopy     Plan: I needed is a 50 year old patient right knee ACL reconstruction 02/20/2020.  CPM is at 90.  On exam she has flexion to 90 and lacks about 7 degrees of full extension.  Negative Homans no calf tenderness.  Graft is stable.  Refill [place].  Start physical therapy here 50% weightbearing plus passive range of motion plus strengthening 3 times a week for 6 weeks.  Colace prescribed.  Mag citrate also prescribed.  Continue with aspirin for DVT prophylaxis.  Patient did have a meniscal repair so we will let her be 50% weightbearing until her return office visit and then advance weightbearing to full weightbearing.  Follow-Up Instructions: Return in about 3 weeks (around 03/23/2020).   Orders:  Orders Placed This Encounter  Procedures  . Ambulatory referral to Physical Therapy   Meds ordered this encounter  Medications  . docusate sodium (COLACE) 100 MG capsule    Sig: Take 1 capsule (100 mg total) by mouth 2 (two) times daily as needed for mild constipation.    Dispense:  30 capsule    Refill:  0  . magnesium citrate SOLN    Sig: Take 296 mLs (1 Bottle total) by mouth once for 1 dose.    Dispense:  195 mL    Refill:  0  . oxycodone (OXY-IR) 5 MG capsule    Sig: Take 1 capsule (5 mg total) by mouth every 6 (six) hours as needed.    Dispense:  30 capsule    Refill:  0    Imaging: No results found.  PMFS History: Patient Active Problem List   Diagnosis Date Noted  . Rupture of anterior cruciate ligament of right knee   . Peripheral tear of lateral meniscus of right knee as current injury   . S/P cervical spinal fusion 01/31/2014    Past Medical History:  Diagnosis Date  . COVID-19 12/2018  . Headache    migrains    Family History  Problem Relation Age of Onset  . Healthy Mother   . Healthy Father     Past Surgical History:  Procedure Laterality Date  . ANTERIOR CERVICAL DECOMP/DISCECTOMY FUSION N/A 01/31/2014   Procedure: C5-6 Anterior Cervical Discectomy and Fusion;  Surgeon: Eldred Manges, MD;  Location: [facility] OR;  Service: Orthopedics;  Laterality: N/A;  . ANTERIOR CRUCIATE LIGAMENT REPAIR Right 02/20/2020   Procedure: RIGHT KNEE ANTERIOR CRUCIATE LIGAMENT (ACL) RECONSTRUCTION HAMSTRING AUTOGRAFT;  Surgeon: Cammy Copa, MD;  Location: [facility] OR;  Service: Orthopedics;  Laterality: Right;  . CESAREAN SECTION    . KNEE ARTHROSCOPY Right 12/91   Social History   Occupational History  . Not on file  Tobacco Use  . Smoking status: Never Smoker  . Smokeless tobacco: Never Used  Vaping Use  . Vaping Use: Never used  Substance and Sexual Activity  . Alcohol use: No  . Drug use: No  . Sexual activity: Not on file

## 2020-03-05 ENCOUNTER — Ambulatory Visit (INDEPENDENT_AMBULATORY_CARE_PROVIDER_SITE_OTHER): Payer: BC Managed Care – PPO | Admitting: Physical Therapy

## 2020-03-05 ENCOUNTER — Other Ambulatory Visit: Payer: Self-pay

## 2020-03-05 ENCOUNTER — Encounter: Payer: Self-pay | Admitting: Physical Therapy

## 2020-03-05 DIAGNOSIS — M25561 Pain in right knee: Secondary | ICD-10-CM

## 2020-03-05 DIAGNOSIS — R2689 Other abnormalities of gait and mobility: Secondary | ICD-10-CM | POA: Diagnosis not present

## 2020-03-05 DIAGNOSIS — M6281 Muscle weakness (generalized): Secondary | ICD-10-CM | POA: Diagnosis not present

## 2020-03-05 DIAGNOSIS — R600 Localized edema: Secondary | ICD-10-CM

## 2020-03-05 DIAGNOSIS — M25661 Stiffness of right knee, not elsewhere classified: Secondary | ICD-10-CM | POA: Diagnosis not present

## 2020-03-05 NOTE — Therapy (Signed)
[facility] Physical Therapy [address] [place], [place], [address] Phone: [phone]   Fax:  [phone]  Physical Therapy Treatment  Patient Details  Name: Mikayla Wiley MRN: [id number] Date of Birth: 07/02/1970 Referring Provider (PT): Rise Paganini, MD   Encounter Date: 03/05/2020   PT End of Session - 03/05/20 1317    Visit Number 2    Number of Visits 30    Date for PT Re-Evaluation 05/26/20    Authorization Type BCBS    Authorization Time Period 20% coinsurance    Progress Note Due on Visit 10    PT Start Time 1305    PT Stop Time 1400    PT Time Calculation (min) 55 min    Equipment Utilized During Treatment Gait belt    Activity Tolerance Patient tolerated treatment well;Patient limited by fatigue    Behavior During Therapy [facility] for tasks assessed/performed           Past Medical History:  Diagnosis Date  . COVID-19 12/2018  . Headache    migrains    Past Surgical History:  Procedure Laterality Date  . ANTERIOR CERVICAL DECOMP/DISCECTOMY FUSION N/A 01/31/2014   Procedure: C5-6 Anterior Cervical Discectomy and Fusion;  Surgeon: Eldred Manges, MD;  Location: [facility] OR;  Service: Orthopedics;  Laterality: N/A;  . ANTERIOR CRUCIATE LIGAMENT REPAIR Right 02/20/2020   Procedure: RIGHT KNEE ANTERIOR CRUCIATE LIGAMENT (ACL) RECONSTRUCTION HAMSTRING AUTOGRAFT;  Surgeon: Cammy Copa, MD;  Location: [facility] OR;  Service: Orthopedics;  Laterality: Right;  . CESAREAN SECTION    . KNEE ARTHROSCOPY Right 12/91    There were no vitals filed for this visit.   Subjective Assessment - 03/05/20 1305    Subjective She was able to go up & down stairs with crutches at her apt with supervision. CPM is at 90* using it 2x/day. She can in & out of it if it is already on bed.    Patient is accompained by: Family member   husband   Pertinent History cervical spinal fusion    Limitations Sitting;Standing;Walking;House hold activities    How long can you stand  comfortably? amb with crutches 50% WB    Patient Stated Goals get back to work, be able to walk, take stairs, do daily activities    Currently in Pain? Yes    Pain Score 6    since PT evaluation, worst 6/10 & best 5/10   Pain Orientation Right;Anterior    Pain Descriptors / Indicators Tightness;Sharp    Pain Type Surgical pain    Pain Onset 1 to 4 weeks ago    Pain Frequency Constant    Aggravating Factors  keeping leg straight, hyperextension movement. bending knee after it been straight    Pain Relieving Factors exercises.                             [facility] Adult PT Treatment/Exercise - 03/05/20 1305      Transfers   Transfers Sit to Stand;Stand to Sit    Sit to Stand 5: Supervision;With upper extremity assist;With armrests;From chair/3-in-1;Other (comment)   to crutches   Sit to Stand Details Visual cues for safe use of DME/AE;Verbal cues for technique;Verbal cues for safe use of DME/AE    Sit to Stand Details (indicate cue type and reason) return demo understanding    Stand to Sit 5: Supervision;With upper extremity assist;With armrests;To chair/3-in-1;Other (comment)   from crutches   Stand to  Sit Details (indicate cue type and reason) Visual cues for safe use of DME/AE;Verbal cues for safe use of DME/AE;Verbal cues for technique    Stand to Sit Details return demo understanding      Ambulation/Gait   Ambulation/Gait Yes    Ambulation/Gait Assistance 5: Supervision;6: Modified independent (Device/Increase time)    Ambulation/Gait Assistance Details 50% WB restriction on RLE - PT demo, verbal & tactile cues on 3-point gait pattern with step through, wt shift over RLE in stance & upright posture (not staring at floor).    Ambulation Distance (Feet) 100 Feet   100' X 2   Assistive device Crutches    Ambulation Surface Level;Indoor      Self-Care   Self-Care Other Self-Care Comments;ADL's    ADL's pillows positioning in sidelying and "tenting" sheets off feet. Pt  & fiance verbalized understanding.    Other Self-Care Comments  elevation in supine with RLE elevated on upside down chair. Ankle pumps or A-Z to help pump edema out of LE. Pt verbalized understanding.      Blood Flow Restriction   Blood Flow Restriction Yes      Blood Flow Restriction-Positions    Blood Flow Restriction Position Supine;Sitting;Standing;Prone      BFR-Supine   Supine Limb Occulsion Pressure (mmHg) 240    Supine Exercise Pressure (mmHg) --   60% = 144 to 80% = 192   Supine Exercise Prescription Comment Cuff size 4      BFR Sitting   Sitting Limb Occulsion Pressure (mmHg) 212    Sitting Exercise Pressure (mmHg) --   60% = 127 to 80% = 170     Knee/Hip Exercises: Aerobic   Recumbent Bike SciFit seat 6 with BUEs assisting BLE movements rocking for max flexion both directions 5-10 sec hold for 8 min.                    PT Short Term Goals - 03/03/20 1522      PT SHORT TERM GOAL #1   Title Patient will be I with beginner HEP    Time 4    Period Weeks    Status New    Target Date 03/31/20      PT SHORT TERM GOAL #2   Title Patient will increase knee flexion by 10-15 degrees and maintain neutral extension    Baseline 0-100 PROM    Time 4    Period Weeks    Status New    Target Date 03/31/20             PT Long Term Goals - 03/03/20 1526      PT LONG TERM GOAL #1   Title Patient will dmeonstrate improved FOTO score to above 60%    Baseline 38% baseline    Time 12    Period Weeks    Status New    Target Date 05/26/20      PT LONG TERM GOAL #2   Title Patient will show AROM 0-125 for rt. knee for improved function    Baseline 0-100    Time 12    Period Weeks    Status New    Target Date 05/26/20      PT LONG TERM GOAL #3   Title Patient will be able to ambulate without AD or supervision for at least 1 hour with minimal breaks for modified return to work    Baseline amb with crutches 50% WB    Time 12  Period Weeks    Status New     Target Date 05/26/20      PT LONG TERM GOAL #4   Title Patient will demonstrate 4+or 5/5 for all RLE measuements for improved function of ADLs and leisure activities.    Baseline 3-/5    Time 12    Period Weeks    Status New    Target Date 05/26/20                 Plan - 03/05/20 1417    Clinical Impression Statement PT educated pt and fiance on proper elevation & position for bed to improve sleep. She appears to understand. PT got occulusion pressures for supine & seated to use BFR next session and explained rationale to pt.  PT instructed in proper gait with crutches with 50% WB and she appears to understand reporting that it hurt her hip less.    Personal Factors and Comorbidities Profession    Examination-Activity Limitations Sit;Stairs;Stand;Locomotion Level    Examination-Participation Restrictions Occupation;Laundry    Stability/Clinical Decision Making Stable/Uncomplicated    Rehab Potential Excellent    PT Frequency 3x / week    PT Duration 12 weeks    PT Treatment/Interventions ADLs/Self Care Home Management;Aquatic Therapy;Electrical Stimulation;Iontophoresis 4mg /ml Dexamethasone;Moist Heat;Ultrasound;Cryotherapy;Neuromuscular re-education;Balance training;Therapeutic exercise;Therapeutic activities;Functional mobility training;Stair training;Gait training;Patient/family education;Manual techniques;Dry needling;Passive range of motion;Scar mobilization;Taping;Vasopneumatic Device;Joint Manipulations    PT Next Visit Plan Continue quad strenghtening, BFR, PROM/mobs for flexion    PT Home Exercise Plan Access Code:    Consulted and Agree with Plan of Care Patient;Family member/caregiver    Family Member Consulted Husband           Patient will benefit from skilled therapeutic intervention in order to improve the following deficits and impairments:  Abnormal gait,Decreased coordination,Decreased range of motion,Difficulty walking,Decreased activity  tolerance,Pain,Decreased balance,Decreased mobility,Decreased strength,Increased edema  Visit Diagnosis: Acute pain of right knee  Stiffness of right knee, not elsewhere classified  Other abnormalities of gait and mobility  Muscle weakness (generalized)  Localized edema     Problem List Patient Active Problem List   Diagnosis Date Noted  . Rupture of anterior cruciate ligament of right knee   . Peripheral tear of lateral meniscus of right knee as current injury   . S/P cervical spinal fusion 01/31/2014    02/02/2014, PT, DPT 03/05/2020, 2:21 PM  [facility] Physical Therapy [address] [place], [place], [place] Phone: [phone]   Fax:  [phone]  Name: Mikayla Wiley MRN: Jeris Penta Date of Birth: December 23, 1970

## 2020-03-09 ENCOUNTER — Encounter: Payer: Self-pay | Admitting: Physical Therapy

## 2020-03-09 ENCOUNTER — Other Ambulatory Visit: Payer: Self-pay

## 2020-03-09 ENCOUNTER — Ambulatory Visit (INDEPENDENT_AMBULATORY_CARE_PROVIDER_SITE_OTHER): Payer: BC Managed Care – PPO | Admitting: Physical Therapy

## 2020-03-09 DIAGNOSIS — R600 Localized edema: Secondary | ICD-10-CM

## 2020-03-09 DIAGNOSIS — R2689 Other abnormalities of gait and mobility: Secondary | ICD-10-CM | POA: Diagnosis not present

## 2020-03-09 DIAGNOSIS — M25561 Pain in right knee: Secondary | ICD-10-CM

## 2020-03-09 DIAGNOSIS — M25661 Stiffness of right knee, not elsewhere classified: Secondary | ICD-10-CM

## 2020-03-09 DIAGNOSIS — M6281 Muscle weakness (generalized): Secondary | ICD-10-CM | POA: Diagnosis not present

## 2020-03-09 NOTE — Therapy (Signed)
[facility] [address] [place], [place], [address] Phone: [phone]   Fax:  [phone]  Physical Therapy Treatment  Patient Details  Name: Mikayla Wiley MRN: [id number] Date of Birth: 25-Mar-1970 Referring Provider (PT): Rise Paganini, MD   Encounter Date: 03/09/2020   PT End of Session - 03/09/20 0938    Visit Number 3    Number of Visits 30    Date for PT Re-Evaluation 05/26/20    Authorization Type BCBS    Authorization Time Period 20% coinsurance    Progress Note Due on Visit 10    PT Start Time 0933    PT Stop Time 1025    PT Time Calculation (min) 52 min    Equipment Utilized During Treatment Gait belt    Activity Tolerance Patient tolerated treatment well;Patient limited by fatigue    Behavior During Therapy [facility] for tasks assessed/performed           Past Medical History:  Diagnosis Date  . COVID-19 12/2018  . Headache    migrains    Past Surgical History:  Procedure Laterality Date  . ANTERIOR CERVICAL DECOMP/DISCECTOMY FUSION N/A 01/31/2014   Procedure: C5-6 Anterior Cervical Discectomy and Fusion;  Surgeon: Eldred Manges, MD;  Location: [facility] OR;  Service: Orthopedics;  Laterality: N/A;  . ANTERIOR CRUCIATE LIGAMENT REPAIR Right 02/20/2020   Procedure: RIGHT KNEE ANTERIOR CRUCIATE LIGAMENT (ACL) RECONSTRUCTION HAMSTRING AUTOGRAFT;  Surgeon: Cammy Copa, MD;  Location: [facility] OR;  Service: Orthopedics;  Laterality: Right;  . CESAREAN SECTION    . KNEE ARTHROSCOPY Right 12/91    There were no vitals filed for this visit.   Subjective Assessment - 03/09/20 0930    Subjective She used CPM 2x/day, elevated & ice over the weekend. She walked with crutches without issues.    Patient is accompained by: Family member   husband   Pertinent History cervical spinal fusion    Limitations Sitting;Standing;Walking;House hold activities    How long can you stand comfortably? amb with crutches 50% WB    Patient Stated Goals get  back to work, be able to walk, take stairs, do daily activities    Currently in Pain? Yes    Pain Score 5    over weekend range 4/10 - 8/10   Pain Location Knee    Pain Orientation Right    Pain Type Acute pain;Surgical pain    Pain Onset 1 to 4 weeks ago    Pain Frequency Intermittent    Aggravating Factors  riding in car,    Pain Relieving Factors ice & meds              [facility] PT Assessment - 03/09/20 0930      Assessment   Medical Diagnosis Rt ACL reconstruction    Referring Provider (PT) Rise Paganini, MD                         [facility] Adult PT Treatment/Exercise - 03/09/20 0930      Transfers   Transfers Sit to Stand;Stand to Sit    Sit to Stand 5: Supervision;With upper extremity assist;With armrests;From chair/3-in-1;Other (comment)   to crutches   Sit to Stand Details Visual cues for safe use of DME/AE;Verbal cues for technique;Verbal cues for safe use of DME/AE    Stand to Sit 5: Supervision;With upper extremity assist;With armrests;To chair/3-in-1;Other (comment)   from crutches   Stand to Sit Details (indicate cue type and reason)  Visual cues for safe use of DME/AE;Verbal cues for safe use of DME/AE;Verbal cues for technique      Ambulation/Gait   Ambulation/Gait --    Ambulation/Gait Assistance --    Ambulation/Gait Assistance Details 50% WB restriction reviewed and PT advised not to walk without crutches until MD lifts restrictions. Pt verbalized understanding.    Ambulation Distance (Feet) --    Assistive device --      Self-Care   Self-Care --    ADL's --    Other Self-Care Comments  --      Blood Flow Restriction   Blood Flow Restriction Yes      Blood Flow Restriction-Positions    Blood Flow Restriction Position Supine;Sitting;Standing;Prone      BFR-Supine   Supine Limb Occulsion Pressure (mmHg) 240    Supine Exercise Pressure (mmHg) 144   60% = 144 to 80% = 192   Supine Exercise Prescription comment    Supine Exercise Prescription  Comment Cuff size 4, quad sets & SAQ      BFR Sitting   Sitting Limb Occulsion Pressure (mmHg) 212    Sitting Exercise Pressure (mmHg) --   60% = 127 to 80% = 170     Knee/Hip Exercises: Aerobic   Recumbent Bike SciFit seat 6 with BUEs assisting BLE movements rocking for max flexion both directions 5-10 sec hold for 8 min.      Modalities   Modalities Vasopneumatic      Vasopneumatic   Number Minutes Vasopneumatic  10 minutes    Vasopnuematic Location  Knee    Vasopneumatic Pressure Medium    Vasopneumatic Temperature  34      Manual Therapy   Manual therapy comments AAROM in supine for knee flexion max tolerable to full extension for 5 minutes.                    PT Short Term Goals - 03/03/20 1522      PT SHORT TERM GOAL #1   Title Patient will be I with beginner HEP    Time 4    Period Weeks    Status New    Target Date 03/31/20      PT SHORT TERM GOAL #2   Title Patient will increase knee flexion by 10-15 degrees and maintain neutral extension    Baseline 0-100 PROM    Time 4    Period Weeks    Status New    Target Date 03/31/20             PT Long Term Goals - 03/03/20 1526      PT LONG TERM GOAL #1   Title Patient will dmeonstrate improved FOTO score to above 60%    Baseline 38% baseline    Time 12    Period Weeks    Status New    Target Date 05/26/20      PT LONG TERM GOAL #2   Title Patient will show AROM 0-125 for rt. knee for improved function    Baseline 0-100    Time 12    Period Weeks    Status New    Target Date 05/26/20      PT LONG TERM GOAL #3   Title Patient will be able to ambulate without AD or supervision for at least 1 hour with minimal breaks for modified return to work    Baseline amb with crutches 50% WB    Time 12    Period Weeks  Status New    Target Date 05/26/20      PT LONG TERM GOAL #4   Title Patient will demonstrate 4+or 5/5 for all RLE measuements for improved function of ADLs and leisure activities.     Baseline 3-/5    Time 12    Period Weeks    Status New    Target Date 05/26/20                 Plan - 03/09/20 0939    Clinical Impression Statement PT exercises using BFR at 60% for supine quad sets & SAQ. Her quadriceps fatigued with exercises.  Pt verbalizes understanding of need to follow WB guidelines until MD changes them. Her next MD appt is 3/21.    Personal Factors and Comorbidities Profession    Examination-Activity Limitations Sit;Stairs;Stand;Locomotion Level    Examination-Participation Restrictions Occupation;Laundry    Stability/Clinical Decision Making Stable/Uncomplicated    Rehab Potential Excellent    PT Frequency 3x / week    PT Duration 12 weeks    PT Treatment/Interventions ADLs/Self Care Home Management;Aquatic Therapy;Electrical Stimulation;Iontophoresis 4mg /ml Dexamethasone;Moist Heat;Ultrasound;Cryotherapy;Neuromuscular re-education;Balance training;Therapeutic exercise;Therapeutic activities;Functional mobility training;Stair training;Gait training;Patient/family education;Manual techniques;Dry needling;Passive range of motion;Scar mobilization;Taping;Vasopneumatic Device;Joint Manipulations    PT Next Visit Plan Continue quad strenghtening, BFR, PROM/mobs for flexion, vaso to end session    PT Home Exercise Plan Access Code:    Consulted and Agree with Plan of Care Patient;Family member/caregiver    Family Member Consulted Husband           Patient will benefit from skilled therapeutic intervention in order to improve the following deficits and impairments:  Abnormal gait,Decreased coordination,Decreased range of motion,Difficulty walking,Decreased activity tolerance,Pain,Decreased balance,Decreased mobility,Decreased strength,Increased edema  Visit Diagnosis: Acute pain of right knee  Stiffness of right knee, not elsewhere classified  Other abnormalities of gait and mobility  Muscle weakness (generalized)  Localized  edema     Problem List Patient Active Problem List   Diagnosis Date Noted  . Rupture of anterior cruciate ligament of right knee   . Peripheral tear of lateral meniscus of right knee as current injury   . S/P cervical spinal fusion 01/31/2014    02/02/2014, PT, DPT 03/09/2020, 11:56 AM  [facility] Therapy [address] [place], [place], [place] Phone: [phone]   Fax:  [phone]  Name: Mikayla Wiley MRN: Jeris Penta Date of Birth: 1970-04-19

## 2020-03-12 ENCOUNTER — Telehealth: Payer: Self-pay | Admitting: Orthopedic Surgery

## 2020-03-12 NOTE — Telephone Encounter (Signed)
Please advise 

## 2020-03-12 NOTE — Telephone Encounter (Signed)
Patient called needing Rx refilled Oxycodone. The number to contact patient is [phone]

## 2020-03-13 ENCOUNTER — Other Ambulatory Visit: Payer: Self-pay

## 2020-03-13 ENCOUNTER — Encounter: Payer: Self-pay | Admitting: Physical Therapy

## 2020-03-13 ENCOUNTER — Ambulatory Visit (INDEPENDENT_AMBULATORY_CARE_PROVIDER_SITE_OTHER): Payer: BC Managed Care – PPO | Admitting: Physical Therapy

## 2020-03-13 ENCOUNTER — Other Ambulatory Visit: Payer: Self-pay | Admitting: Surgical

## 2020-03-13 DIAGNOSIS — M6281 Muscle weakness (generalized): Secondary | ICD-10-CM | POA: Diagnosis not present

## 2020-03-13 DIAGNOSIS — R2689 Other abnormalities of gait and mobility: Secondary | ICD-10-CM

## 2020-03-13 DIAGNOSIS — M25561 Pain in right knee: Secondary | ICD-10-CM

## 2020-03-13 DIAGNOSIS — M25661 Stiffness of right knee, not elsewhere classified: Secondary | ICD-10-CM | POA: Diagnosis not present

## 2020-03-13 DIAGNOSIS — R600 Localized edema: Secondary | ICD-10-CM

## 2020-03-13 MED ORDER — OXYCODONE HCL 5 MG PO CAPS
5.0000 mg | ORAL_CAPSULE | Freq: Two times a day (BID) | ORAL | 0 refills | Status: DC | PRN
Start: 2020-03-13 — End: 2021-01-22

## 2020-03-13 NOTE — Telephone Encounter (Signed)
Please advise 

## 2020-03-13 NOTE — Telephone Encounter (Signed)
Pt called and said she is completely out of her oxycodone and I informed her that the note had been sent to the doctor, we must wait for him. She would like it sent as soon as possible.

## 2020-03-13 NOTE — Therapy (Signed)
[facility] [address] [place], [place], [address] Phone: [phone]   Fax:  [phone]  Physical Therapy Treatment  Patient Details  Name: Mikayla Wiley MRN: [id number] Date of Birth: 1970-07-21 Referring Provider (PT): Rise Paganini, MD   Encounter Date: 03/13/2020   PT End of Session - 03/13/20 1442    Visit Number 4    Number of Visits 30    Date for PT Re-Evaluation 05/26/20    Authorization Type BCBS    Authorization Time Period 20% coinsurance    Progress Note Due on Visit 10    PT Start Time 1345    PT Stop Time 1432    PT Time Calculation (min) 47 min    Equipment Utilized During Treatment Gait belt    Activity Tolerance Patient tolerated treatment well;Patient limited by fatigue    Behavior During Therapy [facility] for tasks assessed/performed           Past Medical History:  Diagnosis Date  . COVID-19 12/2018  . Headache    migrains    Past Surgical History:  Procedure Laterality Date  . ANTERIOR CERVICAL DECOMP/DISCECTOMY FUSION N/A 01/31/2014   Procedure: C5-6 Anterior Cervical Discectomy and Fusion;  Surgeon: Eldred Manges, MD;  Location: [facility] OR;  Service: Orthopedics;  Laterality: N/A;  . ANTERIOR CRUCIATE LIGAMENT REPAIR Right 02/20/2020   Procedure: RIGHT KNEE ANTERIOR CRUCIATE LIGAMENT (ACL) RECONSTRUCTION HAMSTRING AUTOGRAFT;  Surgeon: Cammy Copa, MD;  Location: [facility] OR;  Service: Orthopedics;  Laterality: Right;  . CESAREAN SECTION    . KNEE ARTHROSCOPY Right 12/91    There were no vitals filed for this visit.   Subjective Assessment - 03/13/20 1411    Subjective She relays about 8/10 pain in her Rt knee overall. She still has CPM machine and has been using it.    Patient is accompained by: Family member   husband   Pertinent History cervical spinal fusion    Limitations Sitting;Standing;Walking;House hold activities    How long can you stand comfortably? amb with crutches 50% WB    Patient Stated  Goals get back to work, be able to walk, take stairs, do daily activities    Pain Onset 1 to 4 weeks ago                             [facility] Adult PT Treatment/Exercise - 03/13/20 0001      Ambulation/Gait   Ambulation/Gait Assistance 5: Supervision;6: Modified independent (Device/Increase time)    Ambulation/Gait Assistance Details 50% WB restriction on RLE    Ambulation Distance (Feet) 75 Feet   x2     BFR-Supine   Supine Limb Occulsion Pressure (mmHg) 240    Supine Exercise Pressure (mmHg) --   increased from 144 last session to 150 this session with good tolerance   Supine Exercise Prescription [phone], reps w/ 30-60 sec rest    Supine Exercise Prescription Comment Cuff size 4, quad sets & SAQ      BFR Sitting   Sitting Exercise Pressure (mmHg) 150    Sitting Exercise Prescription 30,15,15,15, reps w/ 30-60 sec rest    Sitting Exercise Prescription Comment long arq quads      Knee/Hip Exercises: Stretches   Lobbyist Right;3 reps;20 seconds    Quad Stretch Limitations supine with strap leg EOB    Other Knee/Hip Stretches supine heelslides AAROM with strap and foot on ball 5 sec X 15  Knee/Hip Exercises: Aerobic   Nustep L4  X8 min                    PT Short Term Goals - 03/03/20 1522      PT SHORT TERM GOAL #1   Title Patient will be I with beginner HEP    Time 4    Period Weeks    Status New    Target Date 03/31/20      PT SHORT TERM GOAL #2   Title Patient will increase knee flexion by 10-15 degrees and maintain neutral extension    Baseline 0-100 PROM    Time 4    Period Weeks    Status New    Target Date 03/31/20             PT Long Term Goals - 03/03/20 1526      PT LONG TERM GOAL #1   Title Patient will dmeonstrate improved FOTO score to above 60%    Baseline 38% baseline    Time 12    Period Weeks    Status New    Target Date 05/26/20      PT LONG TERM GOAL #2   Title Patient will show AROM 0-125 for  rt. knee for improved function    Baseline 0-100    Time 12    Period Weeks    Status New    Target Date 05/26/20      PT LONG TERM GOAL #3   Title Patient will be able to ambulate without AD or supervision for at least 1 hour with minimal breaks for modified return to work    Baseline amb with crutches 50% WB    Time 12    Period Weeks    Status New    Target Date 05/26/20      PT LONG TERM GOAL #4   Title Patient will demonstrate 4+or 5/5 for all RLE measuements for improved function of ADLs and leisure activities.    Baseline 3-/5    Time 12    Period Weeks    Status New    Target Date 05/26/20                 Plan - 03/13/20 1444    Clinical Impression Statement Incrased BFR pressure a little more from 60% at 140 to with good overall tolerance. Knee ROM is overall progressing but she still does not yet have full motion. WB still 50% with crutches until she sees MD again. PT will continue to progress as tolerated.    Personal Factors and Comorbidities Profession    Examination-Activity Limitations Sit;Stairs;Stand;Locomotion Level    Examination-Participation Restrictions Occupation;Laundry    Stability/Clinical Decision Making Stable/Uncomplicated    Rehab Potential Excellent    PT Frequency 3x / week    PT Duration 12 weeks    PT Treatment/Interventions ADLs/Self Care Home Management;Aquatic Therapy;Electrical Stimulation;Iontophoresis 4mg /ml Dexamethasone;Moist Heat;Ultrasound;Cryotherapy;Neuromuscular re-education;Balance training;Therapeutic exercise;Therapeutic activities;Functional mobility training;Stair training;Gait training;Patient/family education;Manual techniques;Dry needling;Passive range of motion;Scar mobilization;Taping;Vasopneumatic Device;Joint Manipulations    PT Next Visit Plan Continue quad strenghtening, BFR, PROM/mobs for flexion, vaso to end session    PT Home Exercise Plan Access Code:    Consulted and Agree with Plan of Care  Patient;Family member/caregiver    Family Member Consulted Husband           Patient will benefit from skilled therapeutic intervention in order to improve the following deficits and impairments:  Abnormal gait,Decreased  coordination,Decreased range of motion,Difficulty walking,Decreased activity tolerance,Pain,Decreased balance,Decreased mobility,Decreased strength,Increased edema  Visit Diagnosis: Acute pain of right knee  Stiffness of right knee, not elsewhere classified  Other abnormalities of gait and mobility  Muscle weakness (generalized)  Localized edema     Problem List Patient Active Problem List   Diagnosis Date Noted  . Rupture of anterior cruciate ligament of right knee   . Peripheral tear of lateral meniscus of right knee as current injury   . S/P cervical spinal fusion 01/31/2014    April Manson, PT,DPT 03/13/2020, 2:46 PM  [facility] [address] [place], [place], [address] Phone: [phone]   Fax:  [phone]  Name: Mikayla Wiley MRN: [id number] Date of Birth: [birth year]

## 2020-03-13 NOTE — Telephone Encounter (Signed)
Sent in to pharm.

## 2020-03-16 ENCOUNTER — Encounter: Payer: BC Managed Care – PPO | Admitting: Physical Therapy

## 2020-03-18 ENCOUNTER — Encounter: Payer: Self-pay | Admitting: Physical Therapy

## 2020-03-18 ENCOUNTER — Other Ambulatory Visit: Payer: Self-pay

## 2020-03-18 ENCOUNTER — Ambulatory Visit (INDEPENDENT_AMBULATORY_CARE_PROVIDER_SITE_OTHER): Payer: BC Managed Care – PPO | Admitting: Physical Therapy

## 2020-03-18 DIAGNOSIS — R2689 Other abnormalities of gait and mobility: Secondary | ICD-10-CM

## 2020-03-18 DIAGNOSIS — M25561 Pain in right knee: Secondary | ICD-10-CM

## 2020-03-18 DIAGNOSIS — M6281 Muscle weakness (generalized): Secondary | ICD-10-CM

## 2020-03-18 DIAGNOSIS — M25661 Stiffness of right knee, not elsewhere classified: Secondary | ICD-10-CM | POA: Diagnosis not present

## 2020-03-18 DIAGNOSIS — R600 Localized edema: Secondary | ICD-10-CM

## 2020-03-18 NOTE — Therapy (Signed)
[facility] [address] [place], [place], [address] Phone: [phone]   Fax:  [phone]  Physical Therapy Treatment  Patient Details  Name: Mikayla Wiley MRN: [id number] Date of Birth: 1970/03/24 Referring Provider (PT): Rise Paganini, MD   Encounter Date: 03/18/2020   PT End of Session - 03/18/20 1035    Visit Number 5    Number of Visits 30    Date for PT Re-Evaluation 05/26/20    Authorization Type BCBS    Authorization Time Period 20% coinsurance    Progress Note Due on Visit 10    PT Start Time 1015    PT Stop Time 1105    PT Time Calculation (min) 50 min    Activity Tolerance Patient tolerated treatment well;Patient limited by fatigue    Behavior During Therapy [facility] for tasks assessed/performed           Past Medical History:  Diagnosis Date  . COVID-19 12/2018  . Headache    migrains    Past Surgical History:  Procedure Laterality Date  . ANTERIOR CERVICAL DECOMP/DISCECTOMY FUSION N/A 01/31/2014   Procedure: C5-6 Anterior Cervical Discectomy and Fusion;  Surgeon: Eldred Manges, MD;  Location: [facility] OR;  Service: Orthopedics;  Laterality: N/A;  . ANTERIOR CRUCIATE LIGAMENT REPAIR Right 02/20/2020   Procedure: RIGHT KNEE ANTERIOR CRUCIATE LIGAMENT (ACL) RECONSTRUCTION HAMSTRING AUTOGRAFT;  Surgeon: Cammy Copa, MD;  Location: [facility] OR;  Service: Orthopedics;  Laterality: Right;  . CESAREAN SECTION    . KNEE ARTHROSCOPY Right 12/91    There were no vitals filed for this visit.   Subjective Assessment - 03/18/20 1035    Subjective She relays about 5-6 overall pain in her Rt knee    Patient is accompained by: Family member   husband   Pertinent History cervical spinal fusion    Limitations Sitting;Standing;Walking;House hold activities    How long can you stand comfortably? amb with crutches 50% WB    Patient Stated Goals get back to work, be able to walk, take stairs, do daily activities    Pain Onset 1 to 4 weeks ago                              [facility] Adult PT Treatment/Exercise - 03/18/20 0001      Ambulation/Gait   Ambulation/Gait Assistance 5: Supervision;6: Modified independent (Device/Increase time)    Ambulation/Gait Assistance Details 50% WB restriction on RLE    Ambulation Distance (Feet) 75 Feet    Assistive device Crutches      BFR-Supine   Supine Exercise Prescription Comment Cuff size 4,  SAQ then clams with green band (reps of 30, 15, 15), SLR(different rep scheme with this due to weakness, 2 sets of 10 reps, then 5 reps with min A)      BFR Sitting   Sitting Exercise Pressure (mmHg) 150    Sitting Exercise Prescription 30,15,15,15, reps w/ 30-60 sec rest    Sitting Exercise Prescription Comment long arq quads      Knee/Hip Exercises: Stretches   Knee: Self-Stretch Limitations seated tailgate stretch with self O.P 10 sec X 3 min    Other Knee/Hip Stretches supine heelslides AAROM with strap and foot on ball 5 sec X 15      Knee/Hip Exercises: Aerobic   Recumbent Bike --    Nustep --      Manual Therapy   Manual therapy comments PROM in supine for  knee flexion max tolerable to full extension                    PT Short Term Goals - 03/03/20 1522      PT SHORT TERM GOAL #1   Title Patient will be I with beginner HEP    Time 4    Period Weeks    Status New    Target Date 03/31/20      PT SHORT TERM GOAL #2   Title Patient will increase knee flexion by 10-15 degrees and maintain neutral extension    Baseline 0-100 PROM    Time 4    Period Weeks    Status New    Target Date 03/31/20             PT Long Term Goals - 03/03/20 1526      PT LONG TERM GOAL #1   Title Patient will dmeonstrate improved FOTO score to above 60%    Baseline 38% baseline    Time 12    Period Weeks    Status New    Target Date 05/26/20      PT LONG TERM GOAL #2   Title Patient will show AROM 0-125 for rt. knee for improved function    Baseline 0-100     Time 12    Period Weeks    Status New    Target Date 05/26/20      PT LONG TERM GOAL #3   Title Patient will be able to ambulate without AD or supervision for at least 1 hour with minimal breaks for modified return to work    Baseline amb with crutches 50% WB    Time 12    Period Weeks    Status New    Target Date 05/26/20      PT LONG TERM GOAL #4   Title Patient will demonstrate 4+or 5/5 for all RLE measuements for improved function of ADLs and leisure activities.    Baseline 3-/5    Time 12    Period Weeks    Status New    Target Date 05/26/20                 Plan - 03/18/20 1037    Clinical Impression Statement Continued with knee flexion ROM and quad strength using BFR. She is slowly progressing as expected, she will follow up with MD after next visit so we will need progress note and will await to see if MD will alllow her to progress from 50% WB.    Personal Factors and Comorbidities Profession    Examination-Activity Limitations Sit;Stairs;Stand;Locomotion Level    Examination-Participation Restrictions Occupation;Laundry    Stability/Clinical Decision Making Stable/Uncomplicated    Rehab Potential Excellent    PT Frequency 3x / week    PT Duration 12 weeks    PT Treatment/Interventions ADLs/Self Care Home Management;Aquatic Therapy;Electrical Stimulation;Iontophoresis 4mg /ml Dexamethasone;Moist Heat;Ultrasound;Cryotherapy;Neuromuscular re-education;Balance training;Therapeutic exercise;Therapeutic activities;Functional mobility training;Stair training;Gait training;Patient/family education;Manual techniques;Dry needling;Passive range of motion;Scar mobilization;Taping;Vasopneumatic Device;Joint Manipulations    PT Next Visit Plan what did MD say? Continue quad strenghtening, BFR, PROM/mobs for flexion, vaso to end session    PT Home Exercise Plan Access Code:    Consulted and Agree with Plan of Care Patient;Family member/caregiver    Family Member  Consulted Husband           Patient will benefit from skilled therapeutic intervention in order to improve the following deficits and impairments:  Abnormal gait,Decreased  coordination,Decreased range of motion,Difficulty walking,Decreased activity tolerance,Pain,Decreased balance,Decreased mobility,Decreased strength,Increased edema  Visit Diagnosis: Acute pain of right knee  Stiffness of right knee, not elsewhere classified  Other abnormalities of gait and mobility  Muscle weakness (generalized)  Localized edema     Problem List Patient Active Problem List   Diagnosis Date Noted  . Rupture of anterior cruciate ligament of right knee   . Peripheral tear of lateral meniscus of right knee as current injury   . S/P cervical spinal fusion 01/31/2014    Birdie Riddle 03/18/2020, 11:19 AM  [facility] Therapy [address] [place], [place], [address] Phone: [phone]   Fax:  [phone]  Name: Mikayla Wiley MRN: [id number] Date of Birth: May 15, 1970

## 2020-03-23 ENCOUNTER — Encounter: Payer: Self-pay | Admitting: Physical Therapy

## 2020-03-23 ENCOUNTER — Other Ambulatory Visit: Payer: Self-pay

## 2020-03-23 ENCOUNTER — Ambulatory Visit (INDEPENDENT_AMBULATORY_CARE_PROVIDER_SITE_OTHER): Payer: BC Managed Care – PPO | Admitting: Physical Therapy

## 2020-03-23 ENCOUNTER — Ambulatory Visit (INDEPENDENT_AMBULATORY_CARE_PROVIDER_SITE_OTHER): Payer: BC Managed Care – PPO | Admitting: Orthopedic Surgery

## 2020-03-23 DIAGNOSIS — M25661 Stiffness of right knee, not elsewhere classified: Secondary | ICD-10-CM | POA: Diagnosis not present

## 2020-03-23 DIAGNOSIS — Z9889 Other specified postprocedural states: Secondary | ICD-10-CM

## 2020-03-23 DIAGNOSIS — M6281 Muscle weakness (generalized): Secondary | ICD-10-CM

## 2020-03-23 DIAGNOSIS — M25561 Pain in right knee: Secondary | ICD-10-CM | POA: Diagnosis not present

## 2020-03-23 DIAGNOSIS — R2689 Other abnormalities of gait and mobility: Secondary | ICD-10-CM

## 2020-03-23 DIAGNOSIS — R600 Localized edema: Secondary | ICD-10-CM

## 2020-03-23 NOTE — Therapy (Signed)
[facility] Physical Therapy [address] [place], [place], [address] Phone: [phone]   Fax:  [phone]  Physical Therapy Treatment  Patient Details  Name: Mikayla Wiley MRN: [id number] Date of Birth: 26-Aug-1970 Referring Provider (PT): Rise Paganini, MD   Encounter Date: 03/23/2020   PT End of Session - 03/23/20 1343    Visit Number 6    Number of Visits 30    Date for PT Re-Evaluation 05/26/20    Authorization Type BCBS    Authorization Time Period 20% coinsurance    Progress Note Due on Visit 10    PT Start Time 1257    PT Stop Time 1341    PT Time Calculation (min) 44 min    Activity Tolerance Patient tolerated treatment well;Patient limited by fatigue    Behavior During Therapy [facility] for tasks assessed/performed           Past Medical History:  Diagnosis Date  . COVID-19 12/2018  . Headache    migrains    Past Surgical History:  Procedure Laterality Date  . ANTERIOR CERVICAL DECOMP/DISCECTOMY FUSION N/A 01/31/2014   Procedure: C5-6 Anterior Cervical Discectomy and Fusion;  Surgeon: Eldred Manges, MD;  [facility];  Service: Orthopedics;  Laterality: N/A;  . ANTERIOR CRUCIATE LIGAMENT REPAIR Right 02/20/2020   Procedure: RIGHT KNEE ANTERIOR CRUCIATE LIGAMENT (ACL) RECONSTRUCTION HAMSTRING AUTOGRAFT;  Surgeon: Cammy Copa, MD;  [facility];  Service: Orthopedics;  Laterality: Right;  . CESAREAN SECTION    . KNEE ARTHROSCOPY Right 12/91    There were no vitals filed for this visit.   Subjective Assessment - 03/23/20 1255    Subjective pain is about a 5/10 today, having some intermittent sharp pains    Patient is accompained by: Family member   husband   Pertinent History cervical spinal fusion    Limitations Sitting;Standing;Walking;House hold activities    How long can you stand comfortably? amb with crutches 50% WB    Patient Stated Goals get back to work, be able to walk, take stairs, do daily activities    Currently  in Pain? Yes    Pain Score 5     Pain Location Knee    Pain Orientation Right    Pain Descriptors / Indicators Tightness;Sharp    Pain Type Acute pain;Surgical pain    Pain Onset 1 to 4 weeks ago    Pain Frequency Intermittent    Aggravating Factors  riding in car    Pain Relieving Factors ice, meds              [facility] PT Assessment - 03/23/20 1309      ROM / Strength   AROM / PROM / Strength AROM      AROM   AROM Assessment Site Knee    Right/Left Knee Right    Right Knee Extension 0    Right Knee Flexion 90   98 after heelslides     PROM   Right Knee Extension 0                         [facility] Adult PT Treatment/Exercise - 03/23/20 1300      BFR-Supine   Supine Limb Occulsion Pressure (mmHg) 240    Supine Exercise Pressure (mmHg) 140   unable to tolerate 144 due to medial pain   Supine Exercise Prescription 30,15,15,15, reps w/ 30-60 sec rest      Knee/Hip Exercises: Aerobic   Nustep L5  X8 min, 4 extremities      Knee/Hip Exercises: Seated   Long Arc Quad Right;3 sets;10 reps      Knee/Hip Exercises: Supine   Quad Sets Right   BFR   Heel Slides AAROM;Right;10 reps                    PT Short Term Goals - 03/03/20 1522      PT SHORT TERM GOAL #1   Title Patient will be I with beginner HEP    Time 4    Period Weeks    Status New    Target Date 03/31/20      PT SHORT TERM GOAL #2   Title Patient will increase knee flexion by 10-15 degrees and maintain neutral extension    Baseline 0-100 PROM    Time 4    Period Weeks    Status New    Target Date 03/31/20             PT Long Term Goals - 03/03/20 1526      PT LONG TERM GOAL #1   Title Patient will dmeonstrate improved FOTO score to above 60%    Baseline 38% baseline    Time 12    Period Weeks    Status New    Target Date 05/26/20      PT LONG TERM GOAL #2   Title Patient will show AROM 0-125 for rt. knee for improved function    Baseline 0-100    Time 12     Period Weeks    Status New    Target Date 05/26/20      PT LONG TERM GOAL #3   Title Patient will be able to ambulate without AD or supervision for at least 1 hour with minimal breaks for modified return to work    Baseline amb with crutches 50% WB    Time 12    Period Weeks    Status New    Target Date 05/26/20      PT LONG TERM GOAL #4   Title Patient will demonstrate 4+or 5/5 for all RLE measuements for improved function of ADLs and leisure activities.    Baseline 3-/5    Time 12    Period Weeks    Status New    Target Date 05/26/20                 Plan - 03/23/20 1343    Clinical Impression Statement Pt with Rt knee AROM 0-98 deg today showing steady progress with ROM.  BFR with increased pain today so had to decrease the pressure.  Overall doing well with PT.    Personal Factors and Comorbidities Profession    Examination-Activity Limitations Sit;Stairs;Stand;Locomotion Level    Examination-Participation Restrictions Occupation;Laundry    Stability/Clinical Decision Making Stable/Uncomplicated    Rehab Potential Excellent    PT Frequency 3x / week    PT Duration 12 weeks    PT Treatment/Interventions ADLs/Self Care Home Management;Aquatic Therapy;Electrical Stimulation;Iontophoresis 4mg /ml Dexamethasone;Moist Heat;Ultrasound;Cryotherapy;Neuromuscular re-education;Balance training;Therapeutic exercise;Therapeutic activities;Functional mobility training;Stair training;Gait training;Patient/family education;Manual techniques;Dry needling;Passive range of motion;Scar mobilization;Taping;Vasopneumatic Device;Joint Manipulations    PT Next Visit Plan what did MD say? Continue quad strenghtening, BFR, PROM/mobs for flexion, vaso to end session    PT Home Exercise Plan Access Code:    Consulted and Agree with Plan of Care Patient;Family member/caregiver    Family Member Consulted Husband  Patient will benefit from skilled therapeutic intervention in  order to improve the following deficits and impairments:  Abnormal gait,Decreased coordination,Decreased range of motion,Difficulty walking,Decreased activity tolerance,Pain,Decreased balance,Decreased mobility,Decreased strength,Increased edema  Visit Diagnosis: Acute pain of right knee  Stiffness of right knee, not elsewhere classified  Other abnormalities of gait and mobility  Muscle weakness (generalized)  Localized edema     Problem List Patient Active Problem List   Diagnosis Date Noted  . Rupture of anterior cruciate ligament of right knee   . Peripheral tear of lateral meniscus of right knee as current injury   . S/P cervical spinal fusion 01/31/2014      Clarita Crane, PT, DPT 03/23/20 1:46 PM     [facility] Physical Therapy [address] [place], [place], [address] Phone: [phone]   Fax:  [phone]  Name: Mikayla Wiley MRN: [id number] Date of Birth: [birth year]

## 2020-03-24 ENCOUNTER — Encounter: Payer: Self-pay | Admitting: Orthopedic Surgery

## 2020-03-24 NOTE — Progress Notes (Signed)
   Post-Op Visit Note   Patient: Mikayla Wiley           Date of Birth: October 28, 1970           MRN: [id number] Visit Date: 03/23/2020 PCP: [facility] [place]   Assessment & Plan:  Chief Complaint:  Chief Complaint  Patient presents with  . Right Knee - Routine Post Op   Visit Diagnoses:  1. S/P right knee arthroscopy     Plan: Mikayla Wiley is a 50 year old patient who underwent right knee ACL reconstruction 02/20/2020.  She is doing better.  50% weightbearing.  Oxycodone about 1 to 2/day.  Mostly at night.  Therapy notes reviewed.  Range of motion 0-98.  On exam no calf tenderness negative Homans.  Graft is stable.  No real effusion present.  She works as an [employer] operational officer.  I think is okay for her to return to work 05/03/2020 with seat available for for 6 months back.  5-week return to make sure she is progressing with leg strengthening range of motion and ambulation.  Okay to do therapy here.  She has  made good progress since last clinic visit.  Follow-Up Instructions: Return in about 5 weeks (around 04/27/2020).   Orders:  No orders of the defined types were placed in this encounter.  No orders of the defined types were placed in this encounter.   Imaging: No results found.  PMFS History: Patient Active Problem List   Diagnosis Date Noted  . Rupture of anterior cruciate ligament of right knee   . Peripheral tear of lateral meniscus of right knee as current injury   . S/P cervical spinal fusion 01/31/2014   Past Medical History:  Diagnosis Date  . COVID-19 12/2018  . Headache    migrains    Family History  Problem Relation Age of Onset  . Healthy Mother   . Healthy Father     Past Surgical History:  Procedure Laterality Date  . ANTERIOR CERVICAL DECOMP/DISCECTOMY FUSION N/A 01/31/2014   Procedure: C5-6 Anterior Cervical Discectomy and Fusion;  Surgeon: Eldred Manges, MD;  [facility];  Service: Orthopedics;  Laterality: N/A;  . ANTERIOR  CRUCIATE LIGAMENT REPAIR Right 02/20/2020   Procedure: RIGHT KNEE ANTERIOR CRUCIATE LIGAMENT (ACL) RECONSTRUCTION HAMSTRING AUTOGRAFT;  Surgeon: Cammy Copa, MD;  [facility];  Service: Orthopedics;  Laterality: Right;  . CESAREAN SECTION    . KNEE ARTHROSCOPY Right 12/91   Social History   Occupational History  . Not on file  Tobacco Use  . Smoking status: Never Smoker  . Smokeless tobacco: Never Used  Vaping Use  . Vaping Use: Never used  Substance and Sexual Activity  . Alcohol use: No  . Drug use: No  . Sexual activity: Not on file

## 2020-03-25 ENCOUNTER — Encounter: Payer: Self-pay | Admitting: Physical Therapy

## 2020-03-25 ENCOUNTER — Other Ambulatory Visit: Payer: Self-pay

## 2020-03-25 ENCOUNTER — Ambulatory Visit (INDEPENDENT_AMBULATORY_CARE_PROVIDER_SITE_OTHER): Payer: BC Managed Care – PPO | Admitting: Physical Therapy

## 2020-03-25 DIAGNOSIS — M25561 Pain in right knee: Secondary | ICD-10-CM | POA: Diagnosis not present

## 2020-03-25 DIAGNOSIS — M25661 Stiffness of right knee, not elsewhere classified: Secondary | ICD-10-CM | POA: Diagnosis not present

## 2020-03-25 DIAGNOSIS — M6281 Muscle weakness (generalized): Secondary | ICD-10-CM | POA: Diagnosis not present

## 2020-03-25 DIAGNOSIS — R600 Localized edema: Secondary | ICD-10-CM

## 2020-03-25 DIAGNOSIS — R2689 Other abnormalities of gait and mobility: Secondary | ICD-10-CM | POA: Diagnosis not present

## 2020-03-25 NOTE — Therapy (Signed)
[facility] Physical Therapy [address] [place], [place], [address] Phone: [phone]   Fax:  [phone]  Physical Therapy Treatment  Patient Details  Name: Mikayla Wiley MRN: [id number] Date of Birth: Sep 19, 1970 Referring Provider (PT): Rise Paganini, MD   Encounter Date: 03/25/2020   PT End of Session - 03/25/20 1512    Visit Number 7    Number of Visits 30    Date for PT Re-Evaluation 05/26/20    Authorization Type BCBS    Authorization Time Period 20% coinsurance    Progress Note Due on Visit 10    PT Start Time 1257    PT Stop Time 1355    PT Time Calculation (min) 58 min    Activity Tolerance Patient tolerated treatment well;Patient limited by fatigue    Behavior During Therapy [facility] for tasks assessed/performed           Past Medical History:  Diagnosis Date  . COVID-19 12/2018  . Headache    migrains    Past Surgical History:  Procedure Laterality Date  . ANTERIOR CERVICAL DECOMP/DISCECTOMY FUSION N/A 01/31/2014   Procedure: C5-6 Anterior Cervical Discectomy and Fusion;  Surgeon: Eldred Manges, MD;  [facility];  Service: Orthopedics;  Laterality: N/A;  . ANTERIOR CRUCIATE LIGAMENT REPAIR Right 02/20/2020   Procedure: RIGHT KNEE ANTERIOR CRUCIATE LIGAMENT (ACL) RECONSTRUCTION HAMSTRING AUTOGRAFT;  Surgeon: Cammy Copa, MD;  [facility];  Service: Orthopedics;  Laterality: Right;  . CESAREAN SECTION    . KNEE ARTHROSCOPY Right 12/91    There were no vitals filed for this visit.   Subjective Assessment - 03/25/20 1256    Subjective arrives today without crutches, now full weight bearing    Patient is accompained by: Family member   husband   Pertinent History cervical spinal fusion    Limitations Sitting;Standing;Walking;House hold activities    How long can you stand comfortably? amb with crutches 50% WB    Patient Stated Goals get back to work, be able to walk, take stairs, do daily activities    Currently in Pain?  Yes    Pain Score 4     Pain Location Knee    Pain Orientation Right    Pain Descriptors / Indicators Tightness;Sharp    Pain Type Acute pain;Surgical pain    Pain Onset 1 to 4 weeks ago    Pain Frequency Intermittent    Aggravating Factors  riding in car    Pain Relieving Factors ice, meds              [facility] PT Assessment - 03/25/20 1510      Assessment   Medical Diagnosis Rt ACL reconstruction    Referring Provider (PT) Rise Paganini, MD    Onset Date/Surgical Date 02/20/20    Next MD Visit 04/22/20      PROM   Right Knee Flexion 104   active assistive                        [facility] Adult PT Treatment/Exercise - 03/25/20 1300      BFR-Supine   Supine Limb Occulsion Pressure (mmHg) 240    Supine Exercise Pressure (mmHg) 140    Supine Exercise Prescription 30,15,15,15, reps w/ 30-60 sec rest      BFR Sitting   Sitting Limb Occulsion Pressure (mmHg) 212    Sitting Exercise Pressure (mmHg) 150    Sitting Exercise Prescription 30,15,15,15, reps w/ 30-60 sec rest  Knee/Hip Exercises: Aerobic   Recumbent Bike partial revolutions x 8 min; seat 4      Knee/Hip Exercises: Seated   Long Arc Quad Right   BFR     Knee/Hip Exercises: Supine   Heel Slides AAROM;Right;20 reps      Knee/Hip Exercises: Sidelying   Hip ABduction Right   BFR 30, 15, 5     Knee/Hip Exercises: Prone   Other Prone Exercises prone quad set with BFR; Rt      Vasopneumatic   Number Minutes Vasopneumatic  10 minutes    Vasopnuematic Location  Knee    Vasopneumatic Pressure Medium    Vasopneumatic Temperature  34                    PT Short Term Goals - 03/25/20 1513      PT SHORT TERM GOAL #1   Title Patient will be I with beginner HEP    Time 4    Period Weeks    Status Achieved    Target Date 03/31/20      PT SHORT TERM GOAL #2   Title Patient will increase knee flexion by 10-15 degrees and maintain neutral extension    Baseline 0-100 PROM    Time 4     Period Weeks    Status On-going    Target Date 03/31/20             PT Long Term Goals - 03/25/20 1513      PT LONG TERM GOAL #1   Title Patient will dmeonstrate improved FOTO score to above 60%    Baseline 38% baseline    Time 12    Period Weeks    Status On-going    Target Date 05/26/20      PT LONG TERM GOAL #2   Title Patient will show AROM 0-125 for rt. knee for improved function    Baseline 0-100    Time 12    Period Weeks    Status On-going      PT LONG TERM GOAL #3   Title Patient will be able to ambulate without AD or supervision for at least 1 hour with minimal breaks for modified return to work    Baseline amb with crutches 50% WB    Time 12    Period Weeks    Status On-going      PT LONG TERM GOAL #4   Title Patient will demonstrate 4+or 5/5 for all RLE measuements for improved function of ADLs and leisure activities.    Baseline 3-/5    Time 12    Period Weeks    Status On-going                 Plan - 03/25/20 1513    Clinical Impression Statement Pt demonstrating steady progress with Rt knee AROM and continue to use BFR to help with strengthening.  She's not WBAT and will work to progress away from crutches.  Will continue to benefit from PT to maximize function.    Personal Factors and Comorbidities Profession    Examination-Activity Limitations Sit;Stairs;Stand;Locomotion Level    Examination-Participation Restrictions Occupation;Laundry    Stability/Clinical Decision Making Stable/Uncomplicated    Rehab Potential Excellent    PT Frequency 3x / week    PT Duration 12 weeks    PT Treatment/Interventions ADLs/Self Care Home Management;Aquatic Therapy;Electrical Stimulation;Iontophoresis 4mg /ml Dexamethasone;Moist Heat;Ultrasound;Cryotherapy;Neuromuscular re-education;Balance training;Therapeutic exercise;Therapeutic activities;Functional mobility training;Stair training;Gait training;Patient/family education;Manual techniques;Dry  needling;Passive range of motion;Scar  mobilization;Taping;Vasopneumatic Device;Joint Manipulations    PT Next Visit Plan Continue quad strenghtening, BFR, PROM/mobs for flexion, vaso to end session    PT Home Exercise Plan Access Code: [email or web address]    Consulted and Agree with Plan of Care Patient;Family member/caregiver    Family Member Consulted Husband           Patient will benefit from skilled therapeutic intervention in order to improve the following deficits and impairments:  Abnormal gait,Decreased coordination,Decreased range of motion,Difficulty walking,Decreased activity tolerance,Pain,Decreased balance,Decreased mobility,Decreased strength,Increased edema  Visit Diagnosis: Acute pain of right knee  Stiffness of right knee, not elsewhere classified  Other abnormalities of gait and mobility  Muscle weakness (generalized)  Localized edema     Problem List Patient Active Problem List   Diagnosis Date Noted  . Rupture of anterior cruciate ligament of right knee   . Peripheral tear of lateral meniscus of right knee as current injury   . S/P cervical spinal fusion 01/31/2014      Clarita Crane, PT, DPT 03/25/20 3:16 PM    [facility] Physical Therapy [address] [place], [place], [address] Phone: [phone]   Fax:  [phone]  Name: GRACIANA SESSA MRN: [id number] Date of Birth: Jul 11, 1970

## 2020-03-30 ENCOUNTER — Other Ambulatory Visit: Payer: Self-pay

## 2020-03-30 ENCOUNTER — Ambulatory Visit (INDEPENDENT_AMBULATORY_CARE_PROVIDER_SITE_OTHER): Payer: BC Managed Care – PPO | Admitting: Physical Therapy

## 2020-03-30 DIAGNOSIS — R2689 Other abnormalities of gait and mobility: Secondary | ICD-10-CM | POA: Diagnosis not present

## 2020-03-30 DIAGNOSIS — M25661 Stiffness of right knee, not elsewhere classified: Secondary | ICD-10-CM | POA: Diagnosis not present

## 2020-03-30 DIAGNOSIS — M6281 Muscle weakness (generalized): Secondary | ICD-10-CM

## 2020-03-30 DIAGNOSIS — M25561 Pain in right knee: Secondary | ICD-10-CM

## 2020-03-30 DIAGNOSIS — R600 Localized edema: Secondary | ICD-10-CM

## 2020-03-30 NOTE — Therapy (Signed)
[facility] Physical Therapy [address] [place], [place], [address] Phone: [phone]   Fax:  [phone]  Physical Therapy Treatment  Patient Details  Name: Mikayla Wiley MRN: [id number] Date of Birth: 03/08/1970 Referring Provider (PT): Marcene Duos, MD   Encounter Date: 03/30/2020   PT End of Session - 03/30/20 1100    Visit Number 8    Number of Visits 30    Date for PT Re-Evaluation 05/26/20    Authorization Type BCBS    Authorization Time Period 20% coinsurance    Progress Note Due on Visit 10    PT Start Time 1016    PT Stop Time 1109    PT Time Calculation (min) 53 min    Activity Tolerance Patient tolerated treatment well;Patient limited by fatigue    Behavior During Therapy [facility] for tasks assessed/performed           Past Medical History:  Diagnosis Date  . COVID-19 12/2018  . Headache    migrains    Past Surgical History:  Procedure Laterality Date  . ANTERIOR CERVICAL DECOMP/DISCECTOMY FUSION N/A 01/31/2014   Procedure: C5-6 Anterior Cervical Discectomy and Fusion;  Surgeon: Marybelle Killings, MD;  Location: [place];  Service: Orthopedics;  Laterality: N/A;  . ANTERIOR CRUCIATE LIGAMENT REPAIR Right 02/20/2020   Procedure: RIGHT KNEE ANTERIOR CRUCIATE LIGAMENT (ACL) RECONSTRUCTION HAMSTRING AUTOGRAFT;  Surgeon: Meredith Pel, MD;  Location: [place];  Service: Orthopedics;  Laterality: Right;  . CESAREAN SECTION    . KNEE ARTHROSCOPY Right 12/91    There were no vitals filed for this visit.   Subjective Assessment - 03/30/20 1036    Subjective arrives today with one crutch, she may have over done it with walking over the weekend so more pain and swelling in her Rt knee and calf, 4/10 pain overall.    Patient is accompained by: Family member   husband   Pertinent History cervical spinal fusion    Limitations Sitting;Standing;Walking;House hold activities    How long can you stand comfortably? now WBAT    Patient Stated Goals get  back to work, be able to walk, take stairs, do daily activities    Pain Onset 1 to 4 weeks ago                             [facility] Adult PT Treatment/Exercise - 03/30/20 0001      Knee/Hip Exercises: Stretches   Knee: Self-Stretch Limitations seated tailgate stretch with self O.P 10 sec X 10, supine heelslides AAROM with strap 5 sec X 15    Gastroc Stretch Both;3 reps;30 seconds    Gastroc Stretch Limitations slantboard      Knee/Hip Exercises: Aerobic   Recumbent Bike slow but able to progress to full revolutions seat 7, 8 min      Knee/Hip Exercises: Seated   Long Arc Quad Right;3 sets;10 reps    Long Arc Quad Weight 2 lbs.    Other Seated Knee/Hip Exercises seated SLR 2 sets of 10      Knee/Hip Exercises: Supine   Bridges Both;10 reps    Straight Leg Raises Right;2 sets;10 reps    Straight Leg Raises Limitations 2#      Knee/Hip Exercises: Sidelying   Hip ABduction Right;2 sets;10 reps    Hip ABduction Limitations 2#      Vasopneumatic   Number Minutes Vasopneumatic  10 minutes    Vasopnuematic Location  Knee  Vasopneumatic Pressure High    Vasopneumatic Temperature  34                    PT Short Term Goals - 03/30/20 1100      PT SHORT TERM GOAL #1   Title Patient will be I with beginner HEP    Time 4    Period Weeks    Status Achieved    Target Date 03/31/20      PT SHORT TERM GOAL #2   Title Patient will increase knee flexion by 10-15 degrees and maintain neutral extension    Baseline 0-100 PROM    Time 4    Period Weeks    Status Achieved    Target Date 03/31/20             PT Long Term Goals - 03/25/20 1513      PT LONG TERM GOAL #1   Title Patient will dmeonstrate improved FOTO score to above 60%    Baseline 38% baseline    Time 12    Period Weeks    Status On-going    Target Date 05/26/20      PT LONG TERM GOAL #2   Title Patient will show AROM 0-125 for rt. knee for improved function    Baseline 0-100     Time 12    Period Weeks    Status On-going      PT LONG TERM GOAL #3   Title Patient will be able to ambulate without AD or supervision for at least 1 hour with minimal breaks for modified return to work    Baseline amb with crutches 50% WB    Time 12    Period Weeks    Status On-going      PT LONG TERM GOAL #4   Title Patient will demonstrate 4+or 5/5 for all RLE measuements for improved function of ADLs and leisure activities.    Baseline 3-/5    Time 12    Period Weeks    Status On-going                 Plan - 03/30/20 1044    Clinical Impression Statement She had overall more pain and swelling likely from incrased weight bearing activity and ambulation over the weekend, provided education about letting the pain and swelling guide how much she is doing at home in weight bearing. Did not perform BFR today as she was having soreness and tighntess and instead switched to less reps with light resistance today and she had good overall tolerance to this. She has now met her short term goals.    Personal Factors and Comorbidities Profession    Examination-Activity Limitations Sit;Stairs;Stand;Locomotion Level    Examination-Participation Restrictions Occupation;Laundry    Stability/Clinical Decision Making Stable/Uncomplicated    Rehab Potential Excellent    PT Frequency 3x / week    PT Duration 12 weeks    PT Treatment/Interventions ADLs/Self Care Home Management;Aquatic Therapy;Electrical Stimulation;Iontophoresis 58m/ml Dexamethasone;Moist Heat;Ultrasound;Cryotherapy;Neuromuscular re-education;Balance training;Therapeutic exercise;Therapeutic activities;Functional mobility training;Stair training;Gait training;Patient/family education;Manual techniques;Dry needling;Passive range of motion;Scar mobilization;Taping;Vasopneumatic Device;Joint Manipulations    PT Next Visit Plan Continue quad strenghtening, BFR, PROM/mobs for flexion, vaso to end session    PT Home Exercise Plan  Access Code: [email or web address]   Consulted and Agree with Plan of Care Patient;Family member/caregiver    Family Member Consulted Husband           Patient will benefit from skilled therapeutic intervention in order  to improve the following deficits and impairments:  Abnormal gait,Decreased coordination,Decreased range of motion,Difficulty walking,Decreased activity tolerance,Pain,Decreased balance,Decreased mobility,Decreased strength,Increased edema  Visit Diagnosis: Acute pain of right knee  Stiffness of right knee, not elsewhere classified  Other abnormalities of gait and mobility  Muscle weakness (generalized)  Localized edema     Problem List Patient Active Problem List   Diagnosis Date Noted  . Rupture of anterior cruciate ligament of right knee   . Peripheral tear of lateral meniscus of right knee as current injury   . S/P cervical spinal fusion 01/31/2014    Silvestre Mesi 03/30/2020, 11:02 AM  [facility] Physical Therapy [address] [place], [place], [address] Phone: [phone]   Fax:  [phone]  Name: Mikayla Wiley MRN: [id number] Date of Birth: [birth year]

## 2020-04-01 ENCOUNTER — Ambulatory Visit (INDEPENDENT_AMBULATORY_CARE_PROVIDER_SITE_OTHER): Payer: BC Managed Care – PPO | Admitting: Physical Therapy

## 2020-04-01 ENCOUNTER — Other Ambulatory Visit: Payer: Self-pay

## 2020-04-01 DIAGNOSIS — M25661 Stiffness of right knee, not elsewhere classified: Secondary | ICD-10-CM | POA: Diagnosis not present

## 2020-04-01 DIAGNOSIS — M6281 Muscle weakness (generalized): Secondary | ICD-10-CM

## 2020-04-01 DIAGNOSIS — R2689 Other abnormalities of gait and mobility: Secondary | ICD-10-CM | POA: Diagnosis not present

## 2020-04-01 DIAGNOSIS — M25561 Pain in right knee: Secondary | ICD-10-CM

## 2020-04-01 DIAGNOSIS — R600 Localized edema: Secondary | ICD-10-CM

## 2020-04-01 NOTE — Therapy (Signed)
[facility] Physical Therapy [address] [place], [place], [address] Phone: [phone]   Fax:  [phone]  Physical Therapy Treatment  Patient Details  Name: Mikayla Wiley MRN: [id number] Date of Birth: May 23, 1970 Referring Provider (PT): Rise Paganini, MD   Encounter Date: 04/01/2020   PT End of Session - 04/01/20 1112    Visit Number 9    Number of Visits 30    Date for PT Re-Evaluation 05/26/20    Authorization Type BCBS    Authorization Time Period 20% coinsurance    Progress Note Due on Visit 10    PT Start Time 1015    PT Stop Time 1100    PT Time Calculation (min) 45 min    Activity Tolerance Patient tolerated treatment well;Patient limited by fatigue    Behavior During Therapy [facility] for tasks assessed/performed           Past Medical History:  Diagnosis Date  . COVID-19 12/2018  . Headache    migrains    Past Surgical History:  Procedure Laterality Date  . ANTERIOR CERVICAL DECOMP/DISCECTOMY FUSION N/A 01/31/2014   Procedure: C5-6 Anterior Cervical Discectomy and Fusion;  Surgeon: Eldred Manges, MD;  Location: [facility] OR;  Service: Orthopedics;  Laterality: N/A;  . ANTERIOR CRUCIATE LIGAMENT REPAIR Right 02/20/2020   Procedure: RIGHT KNEE ANTERIOR CRUCIATE LIGAMENT (ACL) RECONSTRUCTION HAMSTRING AUTOGRAFT;  Surgeon: Cammy Copa, MD;  Location: [facility] OR;  Service: Orthopedics;  Laterality: Right;  . CESAREAN SECTION    . KNEE ARTHROSCOPY Right 12/91    There were no vitals filed for this visit.   Subjective Assessment - 04/01/20 1036    Subjective arrives today feeling overall better, less than 2/10 knee pain and less overall soreness/tightness in her calf.    Patient is accompained by: Family member   husband   Pertinent History cervical spinal fusion    Limitations Sitting;Standing;Walking;House hold activities    How long can you stand comfortably? now WBAT    Patient Stated Goals get back to work, be able to walk, take stairs, do  daily activities    Pain Onset 1 to 4 weeks ago              [facility] PT Assessment - 04/01/20 0001      Assessment   Medical Diagnosis Rt ACL reconstruction    Referring Provider (PT) Rise Paganini, MD    Onset Date/Surgical Date 02/20/20    Next MD Visit 04/22/20      ROM / Strength   AROM / PROM / Strength Strength      AROM   Right Knee Flexion 103      PROM   Right Knee Flexion 110      Strength   Right Knee Flexion 3+/5    Right Knee Extension 3+/5           [facility] Adult PT Treatment/Exercise - 04/01/20 0001      Knee/Hip Exercises: Stretches   Knee: Self-Stretch Limitations seated tailgate stretch with self O.P 10 sec X 10,    Gastroc Stretch Both;3 reps;30 seconds    Gastroc Stretch Limitations slantboard      Knee/Hip Exercises: Aerobic   Recumbent Bike slow but able to progress to full revolutions seat 7, 8 min      Knee/Hip Exercises: Machines for Strengthening   Total Gym Leg Press bilat push 62 lbs 3x10, then R LE 25 lbs 2X10      Knee/Hip Exercises: Standing   Other  Standing Knee Exercises Rt TKE with green band 5 sec hold 2 sets of 10      Knee/Hip Exercises: Seated   Long Arc Quad Right;3 sets;10 reps    Long Arc Quad Weight 2 lbs.    Hamstring Curl Right;2 sets;15 reps    Hamstring Limitations red      Knee/Hip Exercises: Supine   Bridges Both;10 reps      Manual Therapy   Manual therapy comments Rt knee PROM                    PT Short Term Goals - 03/30/20 1100      PT SHORT TERM GOAL #1   Title Patient will be I with beginner HEP    Time 4    Period Weeks    Status Achieved    Target Date 03/31/20      PT SHORT TERM GOAL #2   Title Patient will increase knee flexion by 10-15 degrees and maintain neutral extension    Baseline 0-100 PROM    Time 4    Period Weeks    Status Achieved    Target Date 03/31/20             PT Long Term Goals - 03/25/20 1513      PT LONG TERM GOAL #1   Title Patient will dmeonstrate  improved FOTO score to above 60%    Baseline 38% baseline    Time 12    Period Weeks    Status On-going    Target Date 05/26/20      PT LONG TERM GOAL #2   Title Patient will show AROM 0-125 for rt. knee for improved function    Baseline 0-100    Time 12    Period Weeks    Status On-going      PT LONG TERM GOAL #3   Title Patient will be able to ambulate without AD or supervision for at least 1 hour with minimal breaks for modified return to work    Baseline amb with crutches 50% WB    Time 12    Period Weeks    Status On-going      PT LONG TERM GOAL #4   Title Patient will demonstrate 4+or 5/5 for all RLE measuements for improved function of ADLs and leisure activities.    Baseline 3-/5    Time 12    Period Weeks    Status On-going                 Plan - 04/01/20 1113    Clinical Impression Statement She was able to progress strengthening and ambulation tolerance today. ROM and strength are progressing each week. She is 6 weeks post op now so began light H.S. work.    Personal Factors and Comorbidities Profession    Examination-Activity Limitations Sit;Stairs;Stand;Locomotion Level    Examination-Participation Restrictions Occupation;Laundry    Stability/Clinical Decision Making Stable/Uncomplicated    Rehab Potential Excellent    PT Frequency 3x / week    PT Duration 12 weeks    PT Treatment/Interventions ADLs/Self Care Home Management;Aquatic Therapy;Electrical Stimulation;Iontophoresis 4mg /ml Dexamethasone;Moist Heat;Ultrasound;Cryotherapy;Neuromuscular re-education;Balance training;Therapeutic exercise;Therapeutic activities;Functional mobility training;Stair training;Gait training;Patient/family education;Manual techniques;Dry needling;Passive range of motion;Scar mobilization;Taping;Vasopneumatic Device;Joint Manipulations    PT Next Visit Plan 6 weeks post op 3/31, Continue quad strenghtening, BFR, PROM/mobs for flexion, work on gait and standing tolreance as  able    PT Home Exercise Plan Access Code: [email or web address]  Consulted and Agree with Plan of Care Patient;Family member/caregiver    Family Member Consulted Husband           Patient will benefit from skilled therapeutic intervention in order to improve the following deficits and impairments:  Abnormal gait,Decreased coordination,Decreased range of motion,Difficulty walking,Decreased activity tolerance,Pain,Decreased balance,Decreased mobility,Decreased strength,Increased edema  Visit Diagnosis: Acute pain of right knee  Stiffness of right knee, not elsewhere classified  Other abnormalities of gait and mobility  Muscle weakness (generalized)  Localized edema     Problem List Patient Active Problem List   Diagnosis Date Noted  . Rupture of anterior cruciate ligament of right knee   . Peripheral tear of lateral meniscus of right knee as current injury   . S/P cervical spinal fusion 01/31/2014    April Manson ,PT,DPT 04/01/2020, 11:17 AM  [facility] Physical Therapy [address] [place], [place], [address] Phone: [phone]   Fax:  [phone]  Name: Mikayla Wiley MRN: [id number] Date of Birth: 1970/05/13

## 2020-04-08 ENCOUNTER — Ambulatory Visit (INDEPENDENT_AMBULATORY_CARE_PROVIDER_SITE_OTHER): Payer: BC Managed Care – PPO | Admitting: Physical Therapy

## 2020-04-08 ENCOUNTER — Other Ambulatory Visit: Payer: Self-pay

## 2020-04-08 DIAGNOSIS — M25661 Stiffness of right knee, not elsewhere classified: Secondary | ICD-10-CM | POA: Diagnosis not present

## 2020-04-08 DIAGNOSIS — R2689 Other abnormalities of gait and mobility: Secondary | ICD-10-CM | POA: Diagnosis not present

## 2020-04-08 DIAGNOSIS — M6281 Muscle weakness (generalized): Secondary | ICD-10-CM | POA: Diagnosis not present

## 2020-04-08 DIAGNOSIS — M25561 Pain in right knee: Secondary | ICD-10-CM

## 2020-04-08 DIAGNOSIS — R600 Localized edema: Secondary | ICD-10-CM

## 2020-04-08 NOTE — Therapy (Signed)
[facility] Physical Therapy [address] [place], [place], [address] Phone: [phone]   Fax:  [phone]  Physical Therapy Treatment  Patient Details  Name: Mikayla Wiley MRN: [id number] Date of Birth: 1970/04/30 Referring Provider (PT): Rise Paganini, MD   Encounter Date: 04/08/2020   PT End of Session - 04/08/20 0826    Visit Number 10    Number of Visits 30    Date for PT Re-Evaluation 05/26/20    Authorization Type BCBS    Authorization Time Period 20% coinsurance    Progress Note Due on Visit 20    PT Start Time 0801    PT Stop Time 0845    PT Time Calculation (min) 44 min    Activity Tolerance Patient tolerated treatment well    Behavior During Therapy [facility] for tasks assessed/performed           Past Medical History:  Diagnosis Date  . COVID-19 12/2018  . Headache    migrains    Past Surgical History:  Procedure Laterality Date  . ANTERIOR CERVICAL DECOMP/DISCECTOMY FUSION N/A 01/31/2014   Procedure: C5-6 Anterior Cervical Discectomy and Fusion;  Surgeon: Eldred Manges, MD;  Location: [facility] OR;  Service: Orthopedics;  Laterality: N/A;  . ANTERIOR CRUCIATE LIGAMENT REPAIR Right 02/20/2020   Procedure: RIGHT KNEE ANTERIOR CRUCIATE LIGAMENT (ACL) RECONSTRUCTION HAMSTRING AUTOGRAFT;  Surgeon: Cammy Copa, MD;  [facility]: [facility] OR;  Service: Orthopedics;  Laterality: Right;  . CESAREAN SECTION    . KNEE ARTHROSCOPY Right 12/91    There were no vitals filed for this visit.   Subjective Assessment - 04/08/20 0817    Subjective She reports more medial-posterior knee pain and tenderness today, relays she may have walked too much at a concert where there was no handicap parking available. She relays 6/10 pain today.    Patient is accompained by: Family member   husband   Pertinent History cervical spinal fusion    Limitations Sitting;Standing;Walking;House hold activities    How long can you stand comfortably? now WBAT    Patient Stated Goals  get back to work, be able to walk, take stairs, do daily activities    Pain Onset 1 to 4 weeks ago              [facility] PT Assessment - 04/08/20 0001      Assessment   Medical Diagnosis Rt ACL reconstruction    Referring Provider (PT) Rise Paganini, MD      PROM   Right Knee Extension 0    Right Knee Flexion 115      Strength   Right Knee Flexion 4/5    Right Knee Extension 4/5            [facility] Adult PT Treatment/Exercise - 04/08/20 0001      Knee/Hip Exercises: Stretches   Passive Hamstring Stretch Limitations seated with calf stretch using strap 30 sec X 3    Quad Stretch Right;3 reps;20 seconds    Quad Stretch Limitations supine with strap leg EOB    Knee: Self-Stretch Limitations seated tailgate stretch with self O.P 10 sec X 10,    Other Knee/Hip Stretches hip add stretch 20 sec X 3      Knee/Hip Exercises: Aerobic   Nustep L4X8 min      Knee/Hip Exercises: Seated   Long Arc Quad Right;2 sets;10 reps    Long Arc Quad Weight 2 lbs.    Hamstring Curl Right;2 sets;10 reps    Hamstring Limitations  red                    PT Short Term Goals - 03/30/20 1100      PT SHORT TERM GOAL #1   Title Patient will be I with beginner HEP    Time 4    Period Weeks    Status Achieved    Target Date 03/31/20      PT SHORT TERM GOAL #2   Title Patient will increase knee flexion by 10-15 degrees and maintain neutral extension    Baseline 0-100 PROM    Time 4    Period Weeks    Status Achieved    Target Date 03/31/20             PT Long Term Goals - 03/25/20 1513      PT LONG TERM GOAL #1   Title Patient will dmeonstrate improved FOTO score to above 60%    Baseline 38% baseline    Time 12    Period Weeks    Status On-going    Target Date 05/26/20      PT LONG TERM GOAL #2   Title Patient will show AROM 0-125 for rt. knee for improved function    Baseline 0-100    Time 12    Period Weeks    Status On-going      PT LONG TERM GOAL #3   Title  Patient will be able to ambulate without AD or supervision for at least 1 hour with minimal breaks for modified return to work    Baseline amb with crutches 50% WB    Time 12    Period Weeks    Status On-going      PT LONG TERM GOAL #4   Title Patient will demonstrate 4+or 5/5 for all RLE measuements for improved function of ADLs and leisure activities.    Baseline 3-/5    Time 12    Period Weeks    Status On-going                 Plan - 04/08/20 0827    Clinical Impression Statement She had more pain, soreness and swelling today, likely due to increased walking at a concert. We backed down some today on her strengthening program to improve her overall tolerance to session today. Continued with vaso for edema and soreness reduction. We will attempt to progress back as tolerated.    Personal Factors and Comorbidities Profession    Examination-Activity Limitations Sit;Stairs;Stand;Locomotion Level    Examination-Participation Restrictions Occupation;Laundry    Stability/Clinical Decision Making Stable/Uncomplicated    Rehab Potential Excellent    PT Frequency 3x / week    PT Duration 12 weeks    PT Treatment/Interventions ADLs/Self Care Home Management;Aquatic Therapy;Electrical Stimulation;Iontophoresis 4mg /ml Dexamethasone;Moist Heat;Ultrasound;Cryotherapy;Neuromuscular re-education;Balance training;Therapeutic exercise;Therapeutic activities;Functional mobility training;Stair training;Gait training;Patient/family education;Manual techniques;Dry needling;Passive range of motion;Scar mobilization;Taping;Vasopneumatic Device;Joint Manipulations    PT Next Visit Plan 6 weeks post op 3/31, Continue quad strenghtening, BFR, PROM/mobs for flexion, work on gait and standing tolreance as able    PT Home Exercise Plan Access Code: 4/31    Consulted and Agree with Plan of Care Patient;Family member/caregiver    Family Member Consulted Husband           Patient will benefit from  skilled therapeutic intervention in order to improve the following deficits and impairments:  Abnormal gait,Decreased coordination,Decreased range of motion,Difficulty walking,Decreased activity tolerance,Pain,Decreased balance,Decreased mobility,Decreased strength,Increased edema  Visit Diagnosis: Acute pain  of right knee  Stiffness of right knee, not elsewhere classified  Other abnormalities of gait and mobility  Muscle weakness (generalized)  Localized edema     Problem List Patient Active Problem List   Diagnosis Date Noted  . Rupture of anterior cruciate ligament of right knee   . Peripheral tear of lateral meniscus of right knee as current injury   . S/P cervical spinal fusion 01/31/2014    Birdie Riddle 04/08/2020, 8:40 AM  [facility] Physical Therapy [address] [place], [place], [address] Phone: [phone]   Fax:  [phone]  Name: Mikayla Wiley MRN: [id number] Date of Birth: 1970-12-29

## 2020-04-10 ENCOUNTER — Other Ambulatory Visit: Payer: Self-pay

## 2020-04-10 ENCOUNTER — Ambulatory Visit (INDEPENDENT_AMBULATORY_CARE_PROVIDER_SITE_OTHER): Payer: BC Managed Care – PPO | Admitting: Rehabilitative and Restorative Service Providers"

## 2020-04-10 ENCOUNTER — Encounter: Payer: Self-pay | Admitting: Rehabilitative and Restorative Service Providers"

## 2020-04-10 DIAGNOSIS — R262 Difficulty in walking, not elsewhere classified: Secondary | ICD-10-CM | POA: Diagnosis not present

## 2020-04-10 DIAGNOSIS — M6281 Muscle weakness (generalized): Secondary | ICD-10-CM | POA: Diagnosis not present

## 2020-04-10 DIAGNOSIS — M25661 Stiffness of right knee, not elsewhere classified: Secondary | ICD-10-CM

## 2020-04-10 DIAGNOSIS — M25561 Pain in right knee: Secondary | ICD-10-CM | POA: Diagnosis not present

## 2020-04-10 NOTE — Therapy (Signed)
[facility] Physical Therapy [address] [place], [place], [address] Phone: [phone]   Fax:  [phone]  Physical Therapy Treatment  Patient Details  Name: Mikayla Wiley MRN: [id number] Date of Birth: 06-Nov-1970 Referring Provider (PT): Rise Paganini, MD   Encounter Date: 04/10/2020   PT End of Session - 04/10/20 0850    Visit Number 11    Number of Visits 30    Date for PT Re-Evaluation 05/26/20    Authorization Type BCBS    Authorization Time Period 20% coinsurance    Progress Note Due on Visit 20    PT Start Time 0800    PT Stop Time 0846    PT Time Calculation (min) 46 min    Activity Tolerance Patient tolerated treatment well;No increased pain;Patient limited by fatigue    Behavior During Therapy [facility] for tasks assessed/performed           Past Medical History:  Diagnosis Date  . COVID-19 12/2018  . Headache    migrains    Past Surgical History:  Procedure Laterality Date  . ANTERIOR CERVICAL DECOMP/DISCECTOMY FUSION N/A 01/31/2014   Procedure: C5-6 Anterior Cervical Discectomy and Fusion;  Surgeon: Eldred Manges, MD;  Location: [facility] OR;  Service: Orthopedics;  Laterality: N/A;  . ANTERIOR CRUCIATE LIGAMENT REPAIR Right 02/20/2020   Procedure: RIGHT KNEE ANTERIOR CRUCIATE LIGAMENT (ACL) RECONSTRUCTION HAMSTRING AUTOGRAFT;  Surgeon: Cammy Copa, MD;  Location: [facility] OR;  Service: Orthopedics;  Laterality: Right;  . CESAREAN SECTION    . KNEE ARTHROSCOPY Right 12/91    There were no vitals filed for this visit.   Subjective Assessment - 04/10/20 0841    Subjective [name] reports she is still taking Ibuprofin 2X/day.  She reports good HEP compliance.    Patient is accompained by: Family member   husband   Pertinent History cervical spinal fusion    Limitations Sitting;Standing;Walking;House hold activities    How long can you stand comfortably? now WBAT    Patient Stated Goals get back to work, be able to walk, take stairs, do daily  activities    Currently in Pain? Yes    Pain Score 4     Pain Location Knee    Pain Orientation Right    Pain Descriptors / Indicators Sharp;Tightness    Pain Type Acute pain;Surgical pain    Pain Onset More than a month ago    Pain Frequency Intermittent    Aggravating Factors  Prolonged sitting or too much WB    Pain Relieving Factors Ice and ibuprofen    Effect of Pain on Daily Activities Edema late in the day and has discomfort getting to sleep    Multiple Pain Sites No                             [facility] Adult PT Treatment/Exercise - 04/10/20 0001      Blood Flow Restriction   Blood Flow Restriction Yes      Blood Flow Restriction-Positions    Blood Flow Restriction Position Sitting      BFR-Supine   Supine Limb Occulsion Pressure (mmHg) 240    Supine Exercise Pressure (mmHg) 140    Supine Exercise Prescription 30,15,15,15, reps w/ 30-60 sec rest    Supine Exercise Prescription Comment with quadriceps sets      BFR Sitting   Sitting Limb Occulsion Pressure (mmHg) 212    Sitting Exercise Pressure (mmHg) 150    Sitting  Exercise Prescription Comment with seated straight leg raises      Exercises   Exercises Knee/Hip      Knee/Hip Exercises: Stretches   Knee: Self-Stretch Limitations Seated knee flexion with self-overpressure (L pushes R into flexion) 10X 10 seconds      Knee/Hip Exercises: Machines for Strengthening   Total Gym Leg Press R leg only with BFR 2 sets of 20 slow eccentrics      Knee/Hip Exercises: Seated   Long Arc Quad Strengthening;Both;4 sets;5 reps;Limitations    Long Arc Quad Limitations Seated straight leg raises    Hamstring Curl Strengthening;Right;2 sets;10 reps;Limitations    Hamstring Limitations Red slow eccentrics      Knee/Hip Exercises: Supine   Quad Sets Strengthening;Both;Limitations    Quad Sets Limitations BFR R side (see above)      Knee/Hip Exercises: Sidelying   Hip ABduction Strengthening;Right;2 sets;10  reps;Limitations    Hip ABduction Limitations 2# slow eccentrics                  PT Education - 04/10/20 0844    Education Details Reviewed HEP and importance of consistent compliance.    Person(s) Educated Patient    Methods Explanation;Demonstration;Tactile cues;Verbal cues    Comprehension Verbal cues required;Returned demonstration;Need further instruction;Verbalized understanding;Tactile cues required            PT Short Term Goals - 04/10/20 0845      PT SHORT TERM GOAL #1   Title Patient will be I with beginner HEP    Time 4    Period Weeks    Status Achieved    Target Date 03/31/20      PT SHORT TERM GOAL #2   Title Patient will increase knee flexion by 10-15 degrees and maintain neutral extension    Baseline 0-100 PROM    Time 4    Period Weeks    Status Achieved    Target Date 03/31/20             PT Long Term Goals - 04/10/20 0850      PT LONG TERM GOAL #1   Title Patient will dmeonstrate improved FOTO score to above 60%    Baseline 38% baseline    Time 12    Period Weeks    Status On-going      PT LONG TERM GOAL #2   Title Patient will show AROM 0-125 for rt. knee for improved function    Baseline 0-100    Time 12    Period Weeks    Status On-going      PT LONG TERM GOAL #3   Title Patient will be able to ambulate without AD or supervision for at least 1 hour with minimal breaks for modified return to work    Baseline amb with crutches 50% WB    Time 12    Period Weeks    Status On-going      PT LONG TERM GOAL #4   Title Patient will demonstrate 4+or 5/5 for all RLE measuements for improved function of ADLs and leisure activities.    Baseline 3-/5    Time 12    Period Weeks    Status On-going                 Plan - 04/10/20 0851    Clinical Impression Statement [name] reports improved swelling and less pain this week.  She notes prolonged sitting or weight-bearing still cause increased pain.  Emphasis remains Quadriceps  and Hamstrings strength, knee flexion AROM and proprioception.    Personal Factors and Comorbidities Profession    Examination-Activity Limitations Sit;Stairs;Stand;Locomotion Level    Examination-Participation Restrictions Occupation;Laundry    Stability/Clinical Decision Making Stable/Uncomplicated    Rehab Potential Excellent    PT Frequency 3x / week    PT Duration 12 weeks    PT Treatment/Interventions ADLs/Self Care Home Management;Aquatic Therapy;Electrical Stimulation;Iontophoresis 4mg /ml Dexamethasone;Moist Heat;Ultrasound;Cryotherapy;Neuromuscular re-education;Balance training;Therapeutic exercise;Therapeutic activities;Functional mobility training;Stair training;Gait training;Patient/family education;Manual techniques;Dry needling;Passive range of motion;Scar mobilization;Taping;Vasopneumatic Device;Joint Manipulations    PT Next Visit Plan 6 weeks post op 3/31, Continue quad strenghtening, BFR, PROM/mobs for flexion, work on gait and standing tolreance as able    PT Home Exercise Plan Access Code: 4/31    Consulted and Agree with Plan of Care Patient;Family member/caregiver    Family Member Consulted Husband           Patient will benefit from skilled therapeutic intervention in order to improve the following deficits and impairments:  Abnormal gait,Decreased coordination,Decreased range of motion,Difficulty walking,Decreased activity tolerance,Pain,Decreased balance,Decreased mobility,Decreased strength,Increased edema  Visit Diagnosis: Difficulty walking  Stiffness of right knee, not elsewhere classified  Acute pain of right knee  Muscle weakness (generalized)     Problem List Patient Active Problem List   Diagnosis Date Noted  . Rupture of anterior cruciate ligament of right knee   . Peripheral tear of lateral meniscus of right knee as current injury   . S/P cervical spinal fusion 01/31/2014    02/02/2014 PT, MPT 04/10/2020, 8:53 AM  [facility] Physical Therapy [address] [place], [place], [place] Phone: [phone]   Fax:  [phone]  Name: MALORI MYERS MRN: Jeris Penta Date of Birth: 1970/05/04

## 2020-04-13 ENCOUNTER — Encounter: Payer: BC Managed Care – PPO | Admitting: Physical Therapy

## 2020-04-15 ENCOUNTER — Other Ambulatory Visit: Payer: Self-pay

## 2020-04-15 ENCOUNTER — Ambulatory Visit (INDEPENDENT_AMBULATORY_CARE_PROVIDER_SITE_OTHER): Payer: BC Managed Care – PPO | Admitting: Physical Therapy

## 2020-04-15 ENCOUNTER — Encounter: Payer: Self-pay | Admitting: Physical Therapy

## 2020-04-15 DIAGNOSIS — R262 Difficulty in walking, not elsewhere classified: Secondary | ICD-10-CM

## 2020-04-15 DIAGNOSIS — R600 Localized edema: Secondary | ICD-10-CM

## 2020-04-15 DIAGNOSIS — M6281 Muscle weakness (generalized): Secondary | ICD-10-CM

## 2020-04-15 DIAGNOSIS — M25561 Pain in right knee: Secondary | ICD-10-CM | POA: Diagnosis not present

## 2020-04-15 DIAGNOSIS — M25661 Stiffness of right knee, not elsewhere classified: Secondary | ICD-10-CM

## 2020-04-15 DIAGNOSIS — R2689 Other abnormalities of gait and mobility: Secondary | ICD-10-CM

## 2020-04-15 NOTE — Therapy (Signed)
[facility] Physical Therapy [address] [place], [place], [address] Phone: [phone]   Fax:  [phone]  Physical Therapy Treatment  Patient Details  Name: Mikayla Wiley MRN: [id number] Date of Birth: [birth year] Referring Provider (PT): Rise Paganini, MD   Encounter Date: 04/15/2020   PT End of Session - 04/15/20 1521    Visit Number 12    Number of Visits 30    Date for PT Re-Evaluation 05/26/20    Authorization Type BCBS    Authorization Time Period 20% coinsurance    Progress Note Due on Visit 20    PT Start Time 1434    PT Stop Time 1512    PT Time Calculation (min) 38 min    Activity Tolerance Patient tolerated treatment well;No increased pain;Patient limited by fatigue    Behavior During Therapy [facility] for tasks assessed/performed           Past Medical History:  Diagnosis Date  . COVID-19 12/2018  . Headache    migrains    Past Surgical History:  Procedure Laterality Date  . ANTERIOR CERVICAL DECOMP/DISCECTOMY FUSION N/A 01/31/2014   Procedure: C5-6 Anterior Cervical Discectomy and Fusion;  Surgeon: Eldred Manges, MD;  [facility]: [facility] OR;  Service: Orthopedics;  Laterality: N/A;  . ANTERIOR CRUCIATE LIGAMENT REPAIR Right 02/20/2020   Procedure: RIGHT KNEE ANTERIOR CRUCIATE LIGAMENT (ACL) RECONSTRUCTION HAMSTRING AUTOGRAFT;  Surgeon: Cammy Copa, MD;  Location: [facility] OR;  Service: Orthopedics;  Laterality: Right;  . CESAREAN SECTION    . KNEE ARTHROSCOPY Right 12/91    There were no vitals filed for this visit.   Subjective Assessment - 04/15/20 1458    Subjective Mikayla Wiley reports she is feeling better with her Rt knee, but does still get some swelling in the lower leg with standing activity at times.    Patient is accompained by: Family member   husband   Pertinent History cervical spinal fusion    Limitations Sitting;Standing;Walking;House hold activities    How long can you stand comfortably? now WBAT    Patient Stated Goals get back  to work, be able to walk, take stairs, do daily activities    Pain Onset More than a month ago            [facility] Adult PT Treatment/Exercise - 04/15/20 0001      Knee/Hip Exercises: Stretches   Knee: Self-Stretch Limitations Seated knee flexion with self-overpressure (L pushes R into flexion) 10X 10 seconds      Knee/Hip Exercises: Aerobic   Nustep L5X10 min      Knee/Hip Exercises: Standing   Knee Flexion Right;3 sets;10 reps    Knee Flexion Limitations 3#    Hip Abduction Both;10 reps;2 sets    Abduction Limitations 2# on Rt    Other Standing Knee Exercises mini squats with bilat UE support X10, standing TKE green band 2x10, lateral walking with green band around knees 3 round trips in bars      Knee/Hip Exercises: Seated   Long Arc Quad Right;3 sets;10 reps    Long Arc Quad Weight 3 lbs.      Manual Therapy   Manual therapy comments Rt knee flexion PROM                    PT Short Term Goals - 04/10/20 0845      PT SHORT TERM GOAL #1   Title Patient will be I with beginner HEP    Time 4  Period Weeks    Status Achieved    Target Date 03/31/20      PT SHORT TERM GOAL #2   Title Patient will increase knee flexion by 10-15 degrees and maintain neutral extension    Baseline 0-100 PROM    Time 4    Period Weeks    Status Achieved    Target Date 03/31/20             PT Long Term Goals - 04/10/20 0850      PT LONG TERM GOAL #1   Title Patient will dmeonstrate improved FOTO score to above 60%    Baseline 38% baseline    Time 12    Period Weeks    Status On-going      PT LONG TERM GOAL #2   Title Patient will show AROM 0-125 for rt. knee for improved function    Baseline 0-100    Time 12    Period Weeks    Status On-going      PT LONG TERM GOAL #3   Title Patient will be able to ambulate without AD or supervision for at least 1 hour with minimal breaks for modified return to work    Baseline amb with crutches 50% WB    Time 12    Period  Weeks    Status On-going      PT LONG TERM GOAL #4   Title Patient will demonstrate 4+or 5/5 for all RLE measuements for improved function of ADLs and leisure activities.    Baseline 3-/5    Time 12    Period Weeks    Status On-going                 Plan - 04/15/20 1521    Clinical Impression Statement She showed significant improvement this week from last week with strength, ambulation, and decreased overall pain. We will continue to progress her as tolerated.    Personal Factors and Comorbidities Profession    Examination-Activity Limitations Sit;Stairs;Stand;Locomotion Level    Examination-Participation Restrictions Occupation;Laundry    Stability/Clinical Decision Making Stable/Uncomplicated    Rehab Potential Excellent    PT Frequency 3x / week    PT Duration 12 weeks    PT Treatment/Interventions ADLs/Self Care Home Management;Aquatic Therapy;Electrical Stimulation;Iontophoresis 4mg /ml Dexamethasone;Moist Heat;Ultrasound;Cryotherapy;Neuromuscular re-education;Balance training;Therapeutic exercise;Therapeutic activities;Functional mobility training;Stair training;Gait training;Patient/family education;Manual techniques;Dry needling;Passive range of motion;Scar mobilization;Taping;Vasopneumatic Device;Joint Manipulations    PT Next Visit Plan 6 weeks post op 3/31, Continue quad strenghtening, strengthening and or BFR PROM/mobs for flexion, work on gait and standing tolreance as able    PT Home Exercise Plan Access Code: [email or web address]    Consulted and Agree with Plan of Care Patient;Family member/caregiver    Family Member Consulted Husband           Patient will benefit from skilled therapeutic intervention in order to improve the following deficits and impairments:  Abnormal gait,Decreased coordination,Decreased range of motion,Difficulty walking,Decreased activity tolerance,Pain,Decreased balance,Decreased mobility,Decreased strength,Increased edema  Visit  Diagnosis: Difficulty walking  Stiffness of right knee, not elsewhere classified  Acute pain of right knee  Muscle weakness (generalized)  Other abnormalities of gait and mobility  Localized edema     Problem List Patient Active Problem List   Diagnosis Date Noted  . Rupture of anterior cruciate ligament of right knee   . Peripheral tear of lateral meniscus of right knee as current injury   . S/P cervical spinal fusion 01/31/2014    02/02/2014 04/15/2020, 3:29  PM  [facility] Physical Therapy [address] [place], [place], [address] Phone: [phone]   Fax:  [phone]  Name: Mikayla Wiley MRN: [id number] Date of Birth: 1970/01/24

## 2020-04-20 ENCOUNTER — Ambulatory Visit (INDEPENDENT_AMBULATORY_CARE_PROVIDER_SITE_OTHER): Payer: BC Managed Care – PPO | Admitting: Physical Therapy

## 2020-04-20 ENCOUNTER — Other Ambulatory Visit: Payer: Self-pay

## 2020-04-20 DIAGNOSIS — R2689 Other abnormalities of gait and mobility: Secondary | ICD-10-CM

## 2020-04-20 DIAGNOSIS — R262 Difficulty in walking, not elsewhere classified: Secondary | ICD-10-CM | POA: Diagnosis not present

## 2020-04-20 DIAGNOSIS — R600 Localized edema: Secondary | ICD-10-CM

## 2020-04-20 DIAGNOSIS — M6281 Muscle weakness (generalized): Secondary | ICD-10-CM

## 2020-04-20 DIAGNOSIS — M25661 Stiffness of right knee, not elsewhere classified: Secondary | ICD-10-CM

## 2020-04-20 DIAGNOSIS — M25561 Pain in right knee: Secondary | ICD-10-CM

## 2020-04-20 NOTE — Therapy (Signed)
[facility] Physical Therapy [address] [place], [place], [address] Phone: [phone]   Fax:  [phone]  Physical Therapy Treatment/MD progress note  Patient Details  Name: Mikayla Wiley MRN: [id number] Date of Birth: 06/06/1970 Referring Provider (PT): Rise Paganini, MD   Encounter Date: 04/20/2020   PT End of Session - 04/20/20 1154    Visit Number 13    Number of Visits 30    Date for PT Re-Evaluation 05/26/20    Authorization Type BCBS    Authorization Time Period 20% coinsurance    Progress Note Due on Visit 20    PT Start Time 1115   pt arrives 15 min late   PT Stop Time 1146    PT Time Calculation (min) 31 min    Activity Tolerance Patient tolerated treatment well;No increased pain;Patient limited by fatigue    Behavior During Therapy [facility] for tasks assessed/performed           Past Medical History:  Diagnosis Date  . COVID-19 12/2018  . Headache    migrains    Past Surgical History:  Procedure Laterality Date  . ANTERIOR CERVICAL DECOMP/DISCECTOMY FUSION N/A 01/31/2014   Procedure: C5-6 Anterior Cervical Discectomy and Fusion;  Surgeon: Eldred Manges, MD;  [facility];  Service: Orthopedics;  Laterality: N/A;  . ANTERIOR CRUCIATE LIGAMENT REPAIR Right 02/20/2020   Procedure: RIGHT KNEE ANTERIOR CRUCIATE LIGAMENT (ACL) RECONSTRUCTION HAMSTRING AUTOGRAFT;  Surgeon: Cammy Copa, MD;  [facility] OR;  Service: Orthopedics;  Laterality: Right;  . CESAREAN SECTION    . KNEE ARTHROSCOPY Right 12/91    There were no vitals filed for this visit.   Subjective Assessment - 04/20/20 1150    Subjective She relays overall doing pretty well post op with her Rt knee but does still complain of some swelling when she is on it and some difficulty going down stairs.    Patient is accompained by: Family member   husband   Pertinent History cervical spinal fusion    Limitations Sitting;Standing;Walking;House hold activities    How long can  you stand comfortably? now WBAT    Patient Stated Goals get back to work, be able to walk, take stairs, do daily activities    Pain Onset More than a month ago              [facility] PT Assessment - 04/20/20 0001      Assessment   Medical Diagnosis Rt ACL reconstruction    Referring Provider (PT) Rise Paganini, MD    Onset Date/Surgical Date 02/20/20      AROM   Right Knee Extension 0    Right Knee Flexion 110      PROM   Right Knee Extension 0    Right Knee Flexion 118      Strength   Overall Strength Comments tested in sitting    Strength Assessment Site Hip    Right/Left Hip Right    Right Hip Flexion 5/5    Right Hip ABduction 5/5    Right/Left Knee Right    Right Knee Flexion 4+/5    Right Knee Extension 4/5            [facility] Adult PT Treatment/Exercise - 04/20/20 0001      Knee/Hip Exercises: Stretches   Quad Stretch Right;3 reps;30 seconds    Quad Stretch Limitations supine with strap leg EOB      Knee/Hip Exercises: Aerobic   Nustep L5 X6 min  Knee/Hip Exercises: Machines for Strengthening   Cybex Knee Extension 5 lbs both legs 2x10    Cybex Knee Flexion 20 lbs both legs put using Rt more, 2X10      Knee/Hip Exercises: Standing   Other Standing Knee Exercises min lunges with bilat UE support X10    Other Standing Knee Exercises SL reaches fwd, lateral, retro with one UE support X5 ea      Knee/Hip Exercises: Seated   Sit to Sand 2 sets;10 reps   slightly rasied mat surface with Lt foot further in front for more wt shift to Rt     Knee/Hip Exercises: Supine   Quad Sets Right                    PT Short Term Goals - 04/10/20 0845      PT SHORT TERM GOAL #1   Title Patient will be I with beginner HEP    Time 4    Period Weeks    Status Achieved    Target Date 03/31/20      PT SHORT TERM GOAL #2   Title Patient will increase knee flexion by 10-15 degrees and maintain neutral extension    Baseline 0-100 PROM    Time 4    Period  Weeks    Status Achieved    Target Date 03/31/20             PT Long Term Goals - 04/10/20 0850      PT LONG TERM GOAL #1   Title Patient will dmeonstrate improved FOTO score to above 60%    Baseline 38% baseline    Time 12    Period Weeks    Status On-going      PT LONG TERM GOAL #2   Title Patient will show AROM 0-125 for rt. knee for improved function    Baseline 0-100    Time 12    Period Weeks    Status On-going      PT LONG TERM GOAL #3   Title Patient will be able to ambulate without AD or supervision for at least 1 hour with minimal breaks for modified return to work    Baseline amb with crutches 50% WB    Time 12    Period Weeks    Status On-going      PT LONG TERM GOAL #4   Title Patient will demonstrate 4+or 5/5 for all RLE measuements for improved function of ADLs and leisure activities.    Baseline 3-/5    Time 12    Period Weeks    Status On-going                 Plan - 04/20/20 1157    Clinical Impression Statement She has made good progress in all areas. She does not need AD for ambulaiton but does still have some difficulty with descending stairs. See updated measurements, but she does still lack some Rt knee ROM, and strength and would continue to benefit from skilled PT another 4-6 weeks.    Personal Factors and Comorbidities Profession    Examination-Activity Limitations Sit;Stairs;Stand;Locomotion Level    Examination-Participation Restrictions Occupation;Laundry    Stability/Clinical Decision Making Stable/Uncomplicated    Rehab Potential Excellent    PT Frequency 3x / week    PT Duration 12 weeks    PT Treatment/Interventions ADLs/Self Care Home Management;Aquatic Therapy;Electrical Stimulation;Iontophoresis 4mg /ml Dexamethasone;Moist Heat;Ultrasound;Cryotherapy;Neuromuscular re-education;Balance training;Therapeutic exercise;Therapeutic activities;Functional mobility training;Stair training;Gait training;Patient/family education;Manual  techniques;Dry  needling;Passive range of motion;Scar mobilization;Taping;Vasopneumatic Device;Joint Manipulations    PT Next Visit Plan what did MD say? 6 weeks post op 3/31, Continue quad strenghtening, strengthening and or BFR PROM/mobs for flexion, work on gait and standing tolreance as able    PT Home Exercise Plan Access Code: [email or web address]    Consulted and Agree with Plan of Care Patient;Family member/caregiver    Family Member Consulted Husband           Patient will benefit from skilled therapeutic intervention in order to improve the following deficits and impairments:  Abnormal gait,Decreased coordination,Decreased range of motion,Difficulty walking,Decreased activity tolerance,Pain,Decreased balance,Decreased mobility,Decreased strength,Increased edema  Visit Diagnosis: Difficulty walking  Stiffness of right knee, not elsewhere classified  Acute pain of right knee  Muscle weakness (generalized)  Other abnormalities of gait and mobility  Localized edema     Problem List Patient Active Problem List   Diagnosis Date Noted  . Rupture of anterior cruciate ligament of right knee   . Peripheral tear of lateral meniscus of right knee as current injury   . S/P cervical spinal fusion 01/31/2014    April Manson ,PT,DPT 04/20/2020, 12:00 PM  [facility] Physical Therapy [address] [place], [place], [address] Phone: [phone]   Fax:  [phone]  Name: Mikayla Wiley MRN: [id number] Date of Birth: Sep 29, 1970

## 2020-04-22 ENCOUNTER — Encounter: Payer: Self-pay | Admitting: Orthopedic Surgery

## 2020-04-22 ENCOUNTER — Ambulatory Visit (INDEPENDENT_AMBULATORY_CARE_PROVIDER_SITE_OTHER): Payer: BC Managed Care – PPO | Admitting: Orthopedic Surgery

## 2020-04-22 DIAGNOSIS — Z9889 Other specified postprocedural states: Secondary | ICD-10-CM

## 2020-04-22 MED ORDER — IBUPROFEN 800 MG PO TABS: 800.0000 mg | ORAL_TABLET | Freq: Every day | ORAL | 0 refills | Status: DC | PRN

## 2020-04-22 NOTE — Progress Notes (Signed)
   Post-Op Visit Note   Patient: Mikayla Wiley           Date of Birth: December 24, 1970           MRN: [id number] Visit Date: 04/22/2020 PCP: [facility] [place]   Assessment & Plan:  Chief Complaint:  Chief Complaint  Patient presents with  . Right Knee - Routine Post Op   Visit Diagnoses: No diagnosis found.  Plan: Patient is a 50 year old female presents s/p right knee ACL reconstruction on 2/70/22.  She is continually improving and full weightbearing without assistance.  Go to physical therapy 2 times per week.  Denies any instability or mechanical symptoms.  She reports she is measuring from 0 degrees to 114 degrees in physical therapy.  She has a goal of 135 which is equivalent to her contralateral knee.  On exam she has no effusion, no significant calf tenderness, negative Homans' sign.  Excellent quadricep strength with mild weakness of the hamstring rated at 5 -/5.  Excellent stability of the ACL graft with anterior drawer and Lachman exam.  Patient is doing very well and plan for her to return to work on 05/03/2020 with accommodations.  Follow-up in 2 months for clinical recheck.  Follow-Up Instructions: No follow-ups on file.   Orders:  No orders of the defined types were placed in this encounter.  No orders of the defined types were placed in this encounter.   Imaging: No results found.  PMFS History: Patient Active Problem List   Diagnosis Date Noted  . Rupture of anterior cruciate ligament of right knee   . Peripheral tear of lateral meniscus of right knee as current injury   . S/P cervical spinal fusion 01/31/2014   Past Medical History:  Diagnosis Date  . COVID-19 12/2018  . Headache    migrains    Family History  Problem Relation Age of Onset  . Healthy Mother   . Healthy Father     Past Surgical History:  Procedure Laterality Date  . ANTERIOR CERVICAL DECOMP/DISCECTOMY FUSION N/A 01/31/2014   Procedure: C5-6 Anterior Cervical  Discectomy and Fusion;  Surgeon: Eldred Manges, MD;  [facility];  Service: Orthopedics;  Laterality: N/A;  . ANTERIOR CRUCIATE LIGAMENT REPAIR Right 02/20/2020   Procedure: RIGHT KNEE ANTERIOR CRUCIATE LIGAMENT (ACL) RECONSTRUCTION HAMSTRING AUTOGRAFT;  Surgeon: Cammy Copa, MD;  Location: [facility] OR;  Service: Orthopedics;  Laterality: Right;  . CESAREAN SECTION    . KNEE ARTHROSCOPY Right 12/91   Social History   Occupational History  . Not on file  Tobacco Use  . Smoking status: Never Smoker  . Smokeless tobacco: Never Used  Vaping Use  . Vaping Use: Never used  Substance and Sexual Activity  . Alcohol use: No  . Drug use: No  . Sexual activity: Not on file

## 2020-04-23 ENCOUNTER — Ambulatory Visit (INDEPENDENT_AMBULATORY_CARE_PROVIDER_SITE_OTHER): Payer: BC Managed Care – PPO | Admitting: Physical Therapy

## 2020-04-23 ENCOUNTER — Other Ambulatory Visit: Payer: Self-pay

## 2020-04-23 ENCOUNTER — Encounter: Payer: Self-pay | Admitting: Physical Therapy

## 2020-04-23 DIAGNOSIS — M25561 Pain in right knee: Secondary | ICD-10-CM | POA: Diagnosis not present

## 2020-04-23 DIAGNOSIS — R2689 Other abnormalities of gait and mobility: Secondary | ICD-10-CM

## 2020-04-23 DIAGNOSIS — M25661 Stiffness of right knee, not elsewhere classified: Secondary | ICD-10-CM | POA: Diagnosis not present

## 2020-04-23 DIAGNOSIS — R600 Localized edema: Secondary | ICD-10-CM

## 2020-04-23 DIAGNOSIS — R262 Difficulty in walking, not elsewhere classified: Secondary | ICD-10-CM | POA: Diagnosis not present

## 2020-04-23 DIAGNOSIS — M6281 Muscle weakness (generalized): Secondary | ICD-10-CM

## 2020-04-23 NOTE — Therapy (Signed)
[facility] Physical Therapy [address] [place], [place], [address] Phone: [phone]   Fax:  [phone]  Physical Therapy Treatment  Patient Details  Name: Mikayla Wiley MRN: [id number] Date of Birth: Nov 06, 1970 Referring Provider (PT): Rise Paganini, MD   Encounter Date: 04/23/2020   PT End of Session - 04/23/20 1141    Visit Number 14    Number of Visits 30    Date for PT Re-Evaluation 05/26/20    Authorization Type BCBS    Authorization Time Period 20% coinsurance    Progress Note Due on Visit 20    PT Start Time 1102    PT Stop Time 1140    PT Time Calculation (min) 38 min    Activity Tolerance Patient tolerated treatment well;No increased pain;Patient limited by fatigue    Behavior During Therapy [name]           Past Medical History:  Diagnosis Date  . COVID-19 12/2018  . Headache    migrains    Past Surgical History:  Procedure Laterality Date  . ANTERIOR CERVICAL DECOMP/DISCECTOMY FUSION N/A 01/31/2014   Procedure: C5-6 Anterior Cervical Discectomy and Fusion;  Surgeon: Eldred Manges, MD;  [facility];  Service: Orthopedics;  Laterality: N/A;  . ANTERIOR CRUCIATE LIGAMENT REPAIR Right 02/20/2020   Procedure: RIGHT KNEE ANTERIOR CRUCIATE LIGAMENT (ACL) RECONSTRUCTION HAMSTRING AUTOGRAFT;  Surgeon: Cammy Copa, MD;  [facility];  Service: Orthopedics;  Laterality: Right;  . CESAREAN SECTION    . KNEE ARTHROSCOPY Right 12/91    There were no vitals filed for this visit.   Subjective Assessment - 04/23/20 1122    Subjective She relays overall doing good today with her Rt knee, and MD was pleased with her progress at follow up.    Patient is accompained by: Family member   husband   Pertinent History cervical spinal fusion    Limitations Sitting;Standing;Walking;House hold activities    How long can you stand comfortably? now WBAT    Patient Stated Goals get back to work, be able to walk, take  stairs, do daily activities    Pain Onset More than a month ago              [facility] PT Assessment - 04/23/20 0001      Assessment   Medical Diagnosis Rt ACL reconstruction    Referring Provider (PT) Rise Paganini, MD    Onset Date/Surgical Date 02/20/20      AROM   Right Knee Extension 0    Right Knee Flexion 115      PROM   Right Knee Flexion 121            [facility] Adult PT Treatment/Exercise - 04/23/20 0001      Knee/Hip Exercises: Stretches   Knee: Self-Stretch Limitations supine heelslides with strap 10 sec X10      Knee/Hip Exercises: Aerobic   Recumbent Bike L2 X8 min      Knee/Hip Exercises: Machines for Strengthening   Cybex Knee Extension 5 lbs both legs 3x10    Cybex Knee Flexion 20 lbs both legs put using Rt more, 3X10    Total Gym Leg Press 75#bilat push 2X10 then  37# Rt leg only 2X10      Knee/Hip Exercises: Standing   Step Down Limitations stepping down with Lt leg off of 4 inch step with one UE support X10 reps      Knee/Hip Exercises: Seated   Sit to [employer]  2 sets;10 reps   slightly rasied mat surface with Lt foot further in front for more wt shift to Rt     Manual Therapy   Manual therapy comments Rt knee flexion PROM                    PT Short Term Goals - 04/10/20 0845      PT SHORT TERM GOAL #1   Title Patient will be I with beginner HEP    Time 4    Period Weeks    Status Achieved    Target Date 03/31/20      PT SHORT TERM GOAL #2   Title Patient will increase knee flexion by 10-15 degrees and maintain neutral extension    Baseline 0-100 PROM    Time 4    Period Weeks    Status Achieved    Target Date 03/31/20             PT Long Term Goals - 04/10/20 0850      PT LONG TERM GOAL #1   Title Patient will dmeonstrate improved FOTO score to above 60%    Baseline 38% baseline    Time 12    Period Weeks    Status On-going      PT LONG TERM GOAL #2   Title Patient will show AROM 0-125 for rt. knee for improved  function    Baseline 0-100    Time 12    Period Weeks    Status On-going      PT LONG TERM GOAL #3   Title Patient will be able to ambulate without AD or supervision for at least 1 hour with minimal breaks for modified return to work    Baseline amb with crutches 50% WB    Time 12    Period Weeks    Status On-going      PT LONG TERM GOAL #4   Title Patient will demonstrate 4+or 5/5 for all RLE measuements for improved function of ADLs and leisure activities.    Baseline 3-/5    Time 12    Period Weeks    Status On-going                 Plan - 04/23/20 1130    Clinical Impression Statement She continues to progress well and able to perform more reps or resistance today with strengthening and showed improved ROM. Overall MD appears pleased with her progress and wont need to see her agin for 2 months. We will continue to progress as able.    Personal Factors and Comorbidities Profession    Examination-Activity Limitations Sit;Stairs;Stand;Locomotion Level    Examination-Participation Restrictions Occupation;Laundry    Stability/Clinical Decision Making Stable/Uncomplicated    Rehab Potential Excellent    PT Frequency 3x / week    PT Duration 12 weeks    PT Treatment/Interventions ADLs/Self Care Home Management;Aquatic Therapy;Electrical Stimulation;Iontophoresis 4mg /ml Dexamethasone;Moist Heat;Ultrasound;Cryotherapy;Neuromuscular re-education;Balance training;Therapeutic exercise;Therapeutic activities;Functional mobility training;Stair training;Gait training;Patient/family education;Manual techniques;Dry needling;Passive range of motion;Scar mobilization;Taping;Vasopneumatic Device;Joint Manipulations    PT Next Visit Plan 6 weeks post op 3/31, progress flexion ROM and strength as able    PT Home Exercise Plan Access Code: [email or web address]    Consulted and Agree with Plan of Care Patient;Family member/caregiver    Family Member Consulted Husband           Patient will benefit  from skilled therapeutic intervention in order to improve the following deficits and impairments:  Abnormal gait,Decreased  coordination,Decreased range of motion,Difficulty walking,Decreased activity tolerance,Pain,Decreased balance,Decreased mobility,Decreased strength,Increased edema  Visit Diagnosis: Difficulty walking  Stiffness of right knee, not elsewhere classified  Acute pain of right knee  Muscle weakness (generalized)  Other abnormalities of gait and mobility  Localized edema     Problem List Patient Active Problem List   Diagnosis Date Noted  . Rupture of anterior cruciate ligament of right knee   . Peripheral tear of lateral meniscus of right knee as current injury   . S/P cervical spinal fusion 01/31/2014    Birdie Riddle 04/23/2020, 11:42 AM  [facility] Physical Therapy [address] [place], [place], [address] Phone: [phone]   Fax:  [phone]  Name: Mikayla Wiley MRN: [id number] Date of Birth: Aug 04, 1970

## 2020-04-27 ENCOUNTER — Other Ambulatory Visit: Payer: Self-pay

## 2020-04-27 ENCOUNTER — Encounter: Payer: Self-pay | Admitting: Physical Therapy

## 2020-04-27 ENCOUNTER — Ambulatory Visit (INDEPENDENT_AMBULATORY_CARE_PROVIDER_SITE_OTHER): Payer: BC Managed Care – PPO | Admitting: Physical Therapy

## 2020-04-27 DIAGNOSIS — M6281 Muscle weakness (generalized): Secondary | ICD-10-CM | POA: Diagnosis not present

## 2020-04-27 DIAGNOSIS — R262 Difficulty in walking, not elsewhere classified: Secondary | ICD-10-CM

## 2020-04-27 DIAGNOSIS — R600 Localized edema: Secondary | ICD-10-CM

## 2020-04-27 DIAGNOSIS — M25561 Pain in right knee: Secondary | ICD-10-CM

## 2020-04-27 DIAGNOSIS — M25661 Stiffness of right knee, not elsewhere classified: Secondary | ICD-10-CM

## 2020-04-27 DIAGNOSIS — R2689 Other abnormalities of gait and mobility: Secondary | ICD-10-CM

## 2020-04-27 NOTE — Therapy (Signed)
[facility] [address] [place], [place], [address] Phone: [phone]   Fax:  [phone]  Physical Therapy Treatment  Patient Details  Name: Mikayla Wiley MRN: [id number] Date of Birth: 1970-10-29 Referring Provider (PT): Rise Paganini, MD   Encounter Date: 04/27/2020   PT End of Session - 04/27/20 1115    Visit Number 15    Number of Visits 30    Date for PT Re-Evaluation 05/26/20    Authorization Type BCBS    Authorization Time Period 20% coinsurance    Progress Note Due on Visit 20    PT Start Time 1022    PT Stop Time 1100    PT Time Calculation (min) 38 min    Activity Tolerance Patient tolerated treatment well;No increased pain;Patient limited by fatigue    Behavior During Therapy [facility] for tasks assessed/performed           Past Medical History:  Diagnosis Date  . COVID-19 12/2018  . Headache    migrains    Past Surgical History:  Procedure Laterality Date  . ANTERIOR CERVICAL DECOMP/DISCECTOMY FUSION N/A 01/31/2014   Procedure: C5-6 Anterior Cervical Discectomy and Fusion;  Surgeon: Eldred Manges, MD;  Location: [facility] OR;  Service: Orthopedics;  Laterality: N/A;  . ANTERIOR CRUCIATE LIGAMENT REPAIR Right 02/20/2020   Procedure: RIGHT KNEE ANTERIOR CRUCIATE LIGAMENT (ACL) RECONSTRUCTION HAMSTRING AUTOGRAFT;  Surgeon: Cammy Copa, MD;  Location: [facility] OR;  Service: Orthopedics;  Laterality: Right;  . CESAREAN SECTION    . KNEE ARTHROSCOPY Right 12/91    There were no vitals filed for this visit.   Subjective Assessment - 04/27/20 1024    Subjective still has trouble going down the stairs but otherwise doing well.  wore a small wedge sandal the other day and knee was a little sore after.    Patient is accompained by: Family member   husband   Pertinent History cervical spinal fusion    Limitations Sitting;Standing;Walking;House hold activities    How long can you stand comfortably? now WBAT    Patient Stated Goals get  back to work, be able to walk, take stairs, do daily activities    Currently in Pain? No/denies                             [facility] Adult PT Treatment/Exercise - 04/27/20 1026      Knee/Hip Exercises: Aerobic   Recumbent Bike L4 x 8 min      Knee/Hip Exercises: Machines for Strengthening   Total Gym Leg Press 100# 3x10 bil push; 50# RLE 3x10      Knee/Hip Exercises: Standing   Step Down Limitations LLE lateral heel tap 2x10 with focus on eccentric knee control on Rt    Other Standing Knee Exercises RLE on rockerboard working on eccentric control with step down 2x10; 1 UE support                    PT Short Term Goals - 04/10/20 0845      PT SHORT TERM GOAL #1   Title Patient will be I with beginner HEP    Time 4    Period Weeks    Status Achieved    Target Date 03/31/20      PT SHORT TERM GOAL #2   Title Patient will increase knee flexion by 10-15 degrees and maintain neutral extension    Baseline 0-100 PROM  Time 4    Period Weeks    Status Achieved    Target Date 03/31/20             PT Long Term Goals - 04/10/20 0850      PT LONG TERM GOAL #1   Title Patient will dmeonstrate improved FOTO score to above 60%    Baseline 38% baseline    Time 12    Period Weeks    Status On-going      PT LONG TERM GOAL #2   Title Patient will show AROM 0-125 for rt. knee for improved function    Baseline 0-100    Time 12    Period Weeks    Status On-going      PT LONG TERM GOAL #3   Title Patient will be able to ambulate without AD or supervision for at least 1 hour with minimal breaks for modified return to work    Baseline amb with crutches 50% WB    Time 12    Period Weeks    Status On-going      PT LONG TERM GOAL #4   Title Patient will demonstrate 4+or 5/5 for all RLE measuements for improved function of ADLs and leisure activities.    Baseline 3-/5    Time 12    Period Weeks    Status On-going                 Plan -  04/27/20 1115    Clinical Impression Statement Session today focused on eccentric quad control as she reports difficulty with descending stairs.  Plan to continue with PT 1-2x/wk x 4 more weeks to work on maximizing strength and ROM.  She returns to work next week, so will see how return to work goes.    Personal Factors and Comorbidities Profession    Examination-Activity Limitations Sit;Stairs;Stand;Locomotion Level    Examination-Participation Restrictions Occupation;Laundry    Stability/Clinical Decision Making Stable/Uncomplicated    Rehab Potential Excellent    PT Frequency 3x / week    PT Duration 12 weeks    PT Treatment/Interventions ADLs/Self Care Home Management;Aquatic Therapy;Electrical Stimulation;Iontophoresis 4mg /ml Dexamethasone;Moist Heat;Ultrasound;Cryotherapy;Neuromuscular re-education;Balance training;Therapeutic exercise;Therapeutic activities;Functional mobility training;Stair training;Gait training;Patient/family education;Manual techniques;Dry needling;Passive range of motion;Scar mobilization;Taping;Vasopneumatic Device;Joint Manipulations    PT Next Visit Plan progress flexion ROM and strength as able, eccentric quad control    PT Home Exercise Plan Access Code:    Consulted and Agree with Plan of Care Patient;Family member/caregiver    Family Member Consulted Husband           Patient will benefit from skilled therapeutic intervention in order to improve the following deficits and impairments:  Abnormal gait,Decreased coordination,Decreased range of motion,Difficulty walking,Decreased activity tolerance,Pain,Decreased balance,Decreased mobility,Decreased strength,Increased edema  Visit Diagnosis: Difficulty walking  Stiffness of right knee, not elsewhere classified  Acute pain of right knee  Muscle weakness (generalized)  Other abnormalities of gait and mobility  Localized edema     Problem List Patient Active Problem List   Diagnosis Date  Noted  . Rupture of anterior cruciate ligament of right knee   . Peripheral tear of lateral meniscus of right knee as current injury   . S/P cervical spinal fusion 01/31/2014     02/02/2014, PT, DPT 04/27/20 11:18 AM    [facility] [address] [place], [place], [place] Phone: [phone]   Fax:  [phone]  Name: Mikayla Wiley MRN: Jeris Penta Date of Birth: 02-24-1970

## 2020-04-28 ENCOUNTER — Telehealth: Payer: Self-pay | Admitting: Orthopedic Surgery

## 2020-04-28 NOTE — Telephone Encounter (Signed)
Pt called stating she is having a fit for duty form filled out, she states her job is faxing it today and she would like it filled out by the end of 04/29/20 if possible. Pt would like to be updated when we receive, fill out, and send the form back.   [phone]

## 2020-04-29 ENCOUNTER — Ambulatory Visit (INDEPENDENT_AMBULATORY_CARE_PROVIDER_SITE_OTHER): Payer: BC Managed Care – PPO | Admitting: Physical Therapy

## 2020-04-29 ENCOUNTER — Other Ambulatory Visit: Payer: Self-pay

## 2020-04-29 ENCOUNTER — Encounter: Payer: Self-pay | Admitting: Physical Therapy

## 2020-04-29 DIAGNOSIS — M25561 Pain in right knee: Secondary | ICD-10-CM | POA: Diagnosis not present

## 2020-04-29 DIAGNOSIS — M6281 Muscle weakness (generalized): Secondary | ICD-10-CM

## 2020-04-29 DIAGNOSIS — R262 Difficulty in walking, not elsewhere classified: Secondary | ICD-10-CM

## 2020-04-29 DIAGNOSIS — M25661 Stiffness of right knee, not elsewhere classified: Secondary | ICD-10-CM

## 2020-04-29 DIAGNOSIS — R2689 Other abnormalities of gait and mobility: Secondary | ICD-10-CM

## 2020-04-29 DIAGNOSIS — R600 Localized edema: Secondary | ICD-10-CM

## 2020-04-29 NOTE — Therapy (Signed)
[facility] [address] [place], [place], [address] Phone: [phone]   Fax:  [phone]  Physical Therapy Treatment  Patient Details  Name: Mikayla Wiley MRN: [id number] Date of Birth: [birth year] Referring Provider (PT): Rise Paganini, MD   Encounter Date: 04/29/2020   PT End of Session - 04/29/20 1028    Visit Number 16    Number of Visits 30    Date for PT Re-Evaluation 05/26/20    Authorization Type BCBS    Authorization Time Period 20% coinsurance    Progress Note Due on Visit 20    PT Start Time 1022    PT Stop Time 1100    PT Time Calculation (min) 38 min    Activity Tolerance Patient tolerated treatment well;No increased pain    Behavior During Therapy WFL for tasks assessed/performed           Past Medical History:  Diagnosis Date  . COVID-19 12/2018  . Headache    migrains    Past Surgical History:  Procedure Laterality Date  . ANTERIOR CERVICAL DECOMP/DISCECTOMY FUSION N/A 01/31/2014   Procedure: C5-6 Anterior Cervical Discectomy and Fusion;  Surgeon: Eldred Manges, MD;  Location: [facility];  Service: Orthopedics;  Laterality: N/A;  . ANTERIOR CRUCIATE LIGAMENT REPAIR Right 02/20/2020   Procedure: RIGHT KNEE ANTERIOR CRUCIATE LIGAMENT (ACL) RECONSTRUCTION HAMSTRING AUTOGRAFT;  Surgeon: Cammy Copa, MD;  [facility]: [facility];  Service: Orthopedics;  Laterality: Right;  . CESAREAN SECTION    . KNEE ARTHROSCOPY Right 12/91    There were no vitals filed for this visit.   Subjective Assessment - 04/29/20 1027    Subjective Doing pretty well, 2/10 overall knee pain. Will try to begin back to work on Monday.    Patient is accompained by: Family member   husband   Pertinent History cervical spinal fusion    Limitations Sitting;Standing;Walking;House hold activities    How long can you stand comfortably? now WBAT    Patient Stated Goals get back to work, be able to walk, take stairs, do daily activities    Pain Onset More  than a month ago            [facility] Adult PT Treatment/Exercise - 04/29/20 0001      Knee/Hip Exercises: Stretches   Lobbyist Right;3 reps;30 seconds    Quad Stretch Limitations supine with strap leg EOB    Knee: Self-Stretch Limitations seated tailgate stretch 10 sec X10. supine heelslides with strap 5 sec X10      Knee/Hip Exercises: Aerobic   Recumbent Bike L4 x 8 min      Knee/Hip Exercises: Machines for Strengthening   Cybex Knee Extension 10 lbs both legs 3X10    Cybex Knee Flexion 25 lbs both legs put using Rt more, 3X10    Total Gym Leg Press 100# 3x10 bil push; 50# RLE 3x10      Knee/Hip Exercises: Standing   Step Down Limitations stepping down with Lt leg off of 4 inch step with one UE support X15 reps    Functional Squat Limitations with UE support and going as low as she can holding 1-2 seconds then returning, X15 reps                    PT Short Term Goals - 04/10/20 0845      PT SHORT TERM GOAL #1   Title Patient will be I with beginner HEP    Time 4  Period Weeks    Status Achieved    Target Date 03/31/20      PT SHORT TERM GOAL #2   Title Patient will increase knee flexion by 10-15 degrees and maintain neutral extension    Baseline 0-100 PROM    Time 4    Period Weeks    Status Achieved    Target Date 03/31/20             PT Long Term Goals - 04/10/20 0850      PT LONG TERM GOAL #1   Title Patient will dmeonstrate improved FOTO score to above 60%    Baseline 38% baseline    Time 12    Period Weeks    Status On-going      PT LONG TERM GOAL #2   Title Patient will show AROM 0-125 for rt. knee for improved function    Baseline 0-100    Time 12    Period Weeks    Status On-going      PT LONG TERM GOAL #3   Title Patient will be able to ambulate without AD or supervision for at least 1 hour with minimal breaks for modified return to work    Baseline amb with crutches 50% WB    Time 12    Period Weeks    Status On-going       PT LONG TERM GOAL #4   Title Patient will demonstrate 4+or 5/5 for all RLE measuements for improved function of ADLs and leisure activities.    Baseline 3-/5    Time 12    Period Weeks    Status On-going                 Plan - 04/29/20 1050    Clinical Impression Statement She is overall progressing well over the last 2 weeks but does still have some limitations in knee flexion ROM and quad strength which was the focus of todays session. She will try to return on work next week so we will see how she does with that.    Personal Factors and Comorbidities Profession    Examination-Activity Limitations Sit;Stairs;Stand;Locomotion Level    Examination-Participation Restrictions Occupation;Laundry    Stability/Clinical Decision Making Stable/Uncomplicated    Rehab Potential Excellent    PT Frequency 3x / week    PT Duration 12 weeks    PT Treatment/Interventions ADLs/Self Care Home Management;Aquatic Therapy;Electrical Stimulation;Iontophoresis 4mg /ml Dexamethasone;Moist Heat;Ultrasound;Cryotherapy;Neuromuscular re-education;Balance training;Therapeutic exercise;Therapeutic activities;Functional mobility training;Stair training;Gait training;Patient/family education;Manual techniques;Dry needling;Passive range of motion;Scar mobilization;Taping;Vasopneumatic Device;Joint Manipulations    PT Next Visit Plan progress flexion ROM and strength as able, eccentric quad control    PT Home Exercise Plan Access Code:    Consulted and Agree with Plan of Care Patient;Family member/caregiver    Family Member Consulted Husband           Patient will benefit from skilled therapeutic intervention in order to improve the following deficits and impairments:  Abnormal gait,Decreased coordination,Decreased range of motion,Difficulty walking,Decreased activity tolerance,Pain,Decreased balance,Decreased mobility,Decreased strength,Increased edema  Visit Diagnosis: Difficulty  walking  Stiffness of right knee, not elsewhere classified  Acute pain of right knee  Muscle weakness (generalized)  Other abnormalities of gait and mobility  Localized edema     Problem List Patient Active Problem List   Diagnosis Date Noted  . Rupture of anterior cruciate ligament of right knee   . Peripheral tear of lateral meniscus of right knee as current injury   . S/P cervical  spinal fusion 01/31/2014    Birdie Riddle 04/29/2020, 10:52 AM  [facility] [address] [place], [place], [address] Phone: [phone]   Fax:  [phone]  Name: ARIE GABLE MRN: [id number] Date of Birth: [birth year]

## 2020-04-29 NOTE — Telephone Encounter (Signed)
I have not received form.

## 2020-05-01 NOTE — Telephone Encounter (Signed)
Still no form received.

## 2020-05-04 NOTE — Telephone Encounter (Signed)
LM advising no form yet received.

## 2020-05-08 ENCOUNTER — Other Ambulatory Visit: Payer: Self-pay

## 2020-05-08 ENCOUNTER — Ambulatory Visit (INDEPENDENT_AMBULATORY_CARE_PROVIDER_SITE_OTHER): Payer: BC Managed Care – PPO | Admitting: Rehabilitative and Restorative Service Providers"

## 2020-05-08 ENCOUNTER — Encounter: Payer: Self-pay | Admitting: Rehabilitative and Restorative Service Providers"

## 2020-05-08 DIAGNOSIS — R262 Difficulty in walking, not elsewhere classified: Secondary | ICD-10-CM

## 2020-05-08 DIAGNOSIS — M6281 Muscle weakness (generalized): Secondary | ICD-10-CM | POA: Diagnosis not present

## 2020-05-08 DIAGNOSIS — M25661 Stiffness of right knee, not elsewhere classified: Secondary | ICD-10-CM

## 2020-05-08 DIAGNOSIS — R600 Localized edema: Secondary | ICD-10-CM

## 2020-05-08 DIAGNOSIS — M25561 Pain in right knee: Secondary | ICD-10-CM

## 2020-05-08 NOTE — Therapy (Signed)
[facility] [address] [place], [place], [address] Phone: [phone]   Fax:  [phone]  Physical Therapy Treatment  Patient Details  Name: Mikayla Wiley MRN: [id number] Date of Birth: Oct 11, 1970 Referring Provider (PT): Marcene Duos, MD   Encounter Date: 05/08/2020   PT End of Session - 05/08/20 0844    Visit Number 17    Number of Visits 30    Date for PT Re-Evaluation 05/26/20    Authorization Type BCBS    Authorization Time Period 20% coinsurance    Progress Note Due on Visit 20    PT Start Time 0802    PT Stop Time 3646    PT Time Calculation (min) 42 min    Activity Tolerance Patient tolerated treatment well;No increased pain    Behavior During Therapy WFL for tasks assessed/performed           Past Medical History:  Diagnosis Date  . COVID-19 12/2018  . Headache    migrains    Past Surgical History:  Procedure Laterality Date  . ANTERIOR CERVICAL DECOMP/DISCECTOMY FUSION N/A 01/31/2014   Procedure: C5-6 Anterior Cervical Discectomy and Fusion;  Surgeon: Marybelle Killings, MD;  Location: [facility];  Service: Orthopedics;  Laterality: N/A;  . ANTERIOR CRUCIATE LIGAMENT REPAIR Right 02/20/2020   Procedure: RIGHT KNEE ANTERIOR CRUCIATE LIGAMENT (ACL) RECONSTRUCTION HAMSTRING AUTOGRAFT;  Surgeon: Meredith Pel, MD;  Location: [facility];  Service: Orthopedics;  Laterality: Right;  . CESAREAN SECTION    . KNEE ARTHROSCOPY Right 12/91    There were no vitals filed for this visit.   Subjective Assessment - 05/08/20 0805    Subjective Mikayla Wiley started back to work Monday with 8-10 hour days.  She is taking 1 ibuprofen pre and 1 post-work.    Patient is accompained by: Family member   husband   Pertinent History cervical spinal fusion    Limitations Sitting;Standing;Walking;House hold activities    How long can you stand comfortably? now WBAT    Patient Stated Goals get back to work, be able to walk, take stairs, do daily activities     Currently in Pain? Yes    Pain Score 4     Pain Location Knee    Pain Orientation Right    Pain Descriptors / Indicators Tender;Aching;Sore    Pain Type Acute pain;Surgical pain    Pain Radiating Towards NA    Pain Onset More than a month ago    Pain Frequency Constant    Aggravating Factors  Too much WB    Pain Relieving Factors Ice and ibuprofen    Effect of Pain on Daily Activities Soreness and tired at the end of the day.    Multiple Pain Sites No                             [facility] Adult PT Treatment/Exercise - 05/08/20 0001      Therapeutic Activites    Therapeutic Activities ADL's    ADL's Step-up and over and step-down off 4 and 6 inch inch steps      Knee/Hip Exercises: Aerobic   Recumbent Bike Seat 6 Level 4 for 8 minutes      Knee/Hip Exercises: Machines for Strengthening   Cybex Knee Flexion 25# 2 sets of 15 slow eccentrics    Total Gym Leg Press 112# and 125# 15X each slow eccentrics with both legs and single leg R only 15X 62# slow eccentrics  Knee/Hip Exercises: Seated   Long Arc Quad Strengthening;Both;3 sets;5 reps;Weights    Long Arc Quad Weight 2 lbs.    Long Arc Quad Limitations Seated straight leg raises                  PT Education - 05/08/20 [phone]    Education Details Reviewed HEP with emphasis on quadriceps strength and progressed clinic program.    Person(s) Educated Patient    Methods Explanation;Demonstration;Verbal cues    Comprehension Verbalized understanding;Returned demonstration;Need further instruction;Verbal cues required            PT Short Term Goals - 05/08/20 0837      PT SHORT TERM GOAL #1   Title Patient will be I with beginner HEP    Time 4    Period Weeks    Status Achieved    Target Date 03/31/20      PT SHORT TERM GOAL #2   Title Patient will increase knee flexion by 10-15 degrees and maintain neutral extension    Baseline 0-100 PROM    Time 4    Period Weeks    Status Achieved     Target Date 03/31/20             PT Long Term Goals - 05/08/20 0837      PT LONG TERM GOAL #1   Title Patient will dmeonstrate improved FOTO score to above 60%    Baseline 38% baseline, 68 05/08/2020    Time 12    Period Weeks    Status Achieved      PT LONG TERM GOAL #2   Title Patient will show AROM 0-125 for rt. knee for improved function    Baseline 0-123 05/08/2020    Time 12    Period Weeks    Status Partially Met      PT LONG TERM GOAL #3   Title Patient will be able to ambulate without AD or supervision for at least 1 hour with minimal breaks for modified return to work    Baseline Back at work for 4 days    Time 12    Period Weeks    Status Achieved      PT LONG TERM GOAL #4   Title Patient will demonstrate 4+or 5/5 for all RLE measuements for improved function of ADLs and leisure activities.    Baseline 4- to 4/5    Time 12    Period Weeks    Status On-going                 Plan - 05/08/20 0845    Clinical Impression Statement Mikayla Wiley is making great progress post-ACL reconstruction.  AROM of the right knee is 0-123.  FOTO score 68 (was 38, Goal 60).  She notes fatigue and some pain at the end of her 8-10 hour workday.  Continue quadriceps and hamstrings strength work to meet remaining LTGs including low to no pain at the end of her typical workday (currently 4/10 at the end of the day, 0-2/10 expected).    Personal Factors and Comorbidities Profession    Examination-Activity Limitations Sit;Stairs;Stand;Locomotion Level    Examination-Participation Restrictions Occupation;Laundry    Stability/Clinical Decision Making Stable/Uncomplicated    Rehab Potential Excellent    PT Frequency 3x / week    PT Duration 12 weeks    PT Treatment/Interventions ADLs/Self Care Home Management;Aquatic Therapy;Electrical Stimulation;Iontophoresis 60m/ml Dexamethasone;Moist Heat;Ultrasound;Cryotherapy;Neuromuscular re-education;Balance training;Therapeutic exercise;Therapeutic  activities;Functional mobility training;Stair training;Gait training;Patient/family education;Manual techniques;Dry needling;Passive range  of motion;Scar mobilization;Taping;Vasopneumatic Device;Joint Manipulations    PT Next Visit Plan progress flexion ROM and strength as able, eccentric quad control    PT Home Exercise Plan Access Code: [email or web address]    Consulted and Agree with Plan of Care Patient;Family member/caregiver    Family Member Consulted Husband           Patient will benefit from skilled therapeutic intervention in order to improve the following deficits and impairments:  Abnormal gait,Decreased coordination,Decreased range of motion,Difficulty walking,Decreased activity tolerance,Pain,Decreased balance,Decreased mobility,Decreased strength,Increased edema  Visit Diagnosis: Difficulty walking  Stiffness of right knee, not elsewhere classified  Muscle weakness (generalized)  Acute pain of right knee  Localized edema     Problem List Patient Active Problem List   Diagnosis Date Noted  . Rupture of anterior cruciate ligament of right knee   . Peripheral tear of lateral meniscus of right knee as current injury   . S/P cervical spinal fusion 01/31/2014    Farley Ly PT, MPT 05/08/2020, 8:47 AM  [facility] Physical Therapy [address] [place], [place], [address] Phone: [phone]   Fax:  [phone]  Name: Mikayla Wiley MRN: [id number] Date of Birth: 06-Jun-1970

## 2020-05-15 ENCOUNTER — Encounter: Payer: BC Managed Care – PPO | Admitting: Physical Therapy

## 2020-05-15 ENCOUNTER — Encounter: Payer: Self-pay | Admitting: Physical Therapy

## 2020-05-20 ENCOUNTER — Other Ambulatory Visit: Payer: Self-pay

## 2020-05-21 MED ORDER — IBUPROFEN 800 MG PO TABS: 800.0000 mg | ORAL_TABLET | Freq: Every day | ORAL | 0 refills | Status: DC | PRN

## 2020-05-22 ENCOUNTER — Other Ambulatory Visit: Payer: Self-pay

## 2020-05-22 ENCOUNTER — Encounter: Payer: Self-pay | Admitting: Rehabilitative and Restorative Service Providers"

## 2020-05-22 ENCOUNTER — Ambulatory Visit (INDEPENDENT_AMBULATORY_CARE_PROVIDER_SITE_OTHER): Payer: BC Managed Care – PPO | Admitting: Rehabilitative and Restorative Service Providers"

## 2020-05-22 DIAGNOSIS — M25561 Pain in right knee: Secondary | ICD-10-CM

## 2020-05-22 DIAGNOSIS — R600 Localized edema: Secondary | ICD-10-CM | POA: Diagnosis not present

## 2020-05-22 DIAGNOSIS — R262 Difficulty in walking, not elsewhere classified: Secondary | ICD-10-CM | POA: Diagnosis not present

## 2020-05-22 DIAGNOSIS — M6281 Muscle weakness (generalized): Secondary | ICD-10-CM | POA: Diagnosis not present

## 2020-05-22 DIAGNOSIS — M25661 Stiffness of right knee, not elsewhere classified: Secondary | ICD-10-CM

## 2020-05-22 NOTE — Therapy (Addendum)
[facility] [address] [place], [place], [address] Phone: [phone]   Fax:  [phone]  Physical Therapy Treatment/Discharge   Patient Details  Name: Mikayla Wiley MRN: [id number] Date of Birth: 1970/02/20 Referring Provider (PT): Marcene Duos, MD   Encounter Date: 05/22/2020   PT End of Session - 05/22/20 0841     Visit Number 18    Number of Visits 30    Date for PT Re-Evaluation 05/26/20    Authorization Type BCBS    Authorization Time Period 20% coinsurance    Progress Note Due on Visit 20    PT Start Time 0803    PT Stop Time 0849    PT Time Calculation (min) 46 min    Activity Tolerance Patient tolerated treatment well;No increased pain;Patient limited by fatigue    Behavior During Therapy [facility] for tasks assessed/performed             Past Medical History:  Diagnosis Date   COVID-19 12/2018   Headache    migrains    Past Surgical History:  Procedure Laterality Date   ANTERIOR CERVICAL DECOMP/DISCECTOMY FUSION N/A 01/31/2014   Procedure: C5-6 Anterior Cervical Discectomy and Fusion;  Surgeon: Marybelle Killings, MD;  Location: [place];  Service: Orthopedics;  Laterality: N/A;   ANTERIOR CRUCIATE LIGAMENT REPAIR Right 02/20/2020   Procedure: RIGHT KNEE ANTERIOR CRUCIATE LIGAMENT (ACL) RECONSTRUCTION HAMSTRING AUTOGRAFT;  Surgeon: Meredith Pel, MD;  Location: [place];  Service: Orthopedics;  Laterality: Right;   CESAREAN SECTION     KNEE ARTHROSCOPY Right 12/91    There were no vitals filed for this visit.   Subjective Assessment - 05/22/20 0814     Subjective Mikayla Wiley is still taking an ibuprofen 800 pre and post-work.  She reports getting her exercises in during the day, patrticularly seated straight leg raises.    Patient is accompained by: Family member   husband   Pertinent History cervical spinal fusion    Limitations Sitting;Standing;Walking;House hold activities    How long can you sit comfortably? > an hour     How long can you stand comfortably? Hour    How long can you walk comfortably? 10-15 minutes    Patient Stated Goals Better standing and walking endurance    Currently in Pain? Yes    Pain Score 6     Pain Location Knee    Pain Orientation Right    Pain Descriptors / Indicators Aching;Tender    Pain Type Acute pain;Surgical pain    Pain Radiating Towards NA    Pain Onset More than a month ago    Pain Frequency Constant    Aggravating Factors  Late day edema with work and prolonged WB    Pain Relieving Factors Ice and ibuprofen    Effect of Pain on Daily Activities Instability with fatigue and soreness at the end of the day    Multiple Pain Sites No                               [facility] Adult PT Treatment/Exercise - 05/22/20 0001       Therapeutic Activites    Therapeutic Activities ADL's    ADL's Step-down off 4 inch step with slow eccentrics and hands PRN      Neuro Re-ed    Neuro Re-ed Details  Heel to toe eyes closed; single leg stance eyes open/closed/ball of foot 3X 20 seconds each  Exercises   Exercises Knee/Hip      Knee/Hip Exercises: Aerobic   Recumbent Bike Seat 6 Level 4 for 8 minutes      Knee/Hip Exercises: Machines for Strengthening   Cybex Knee Flexion 15# (R only) 2 sets of 15    Total Gym Leg Press 112# double leg and 62# single leg (R) 15X slow eccentrics      Knee/Hip Exercises: Seated   Long Arc Quad Strengthening;Both;3 sets;5 reps;Weights    Long Arc Quad Weight 2 lbs.    Long Arc Quad Limitations Seated straight leg raises                    PT Education - 05/22/20 0840     Education Details Reviewed HEP with progressions with stairs and proprioception.  Still quadriceps emphasis.    Person(s) Educated Patient    Methods Explanation;Demonstration;Verbal cues    Comprehension Verbal cues required;Returned demonstration;Need further instruction;Verbalized understanding              PT Short Term Goals -  05/22/20 0840       PT SHORT TERM GOAL #1   Title Patient will be I with beginner HEP    Time 4    Period Weeks    Status Achieved    Target Date 03/31/20      PT SHORT TERM GOAL #2   Title Patient will increase knee flexion by 10-15 degrees and maintain neutral extension    Baseline 0-100 PROM    Time 4    Period Weeks    Status Achieved    Target Date 03/31/20               PT Long Term Goals - 05/22/20 0840       PT LONG TERM GOAL #1   Title Patient will demonstrate improved FOTO score to above 60%    Baseline 38% baseline, 68 05/08/2020    Time 12    Period Weeks    Status Achieved      PT LONG TERM GOAL #2   Title Patient will show AROM 0-125 for rt. knee for improved function    Baseline 0-123 05/08/2020    Time 12    Period Weeks    Status Partially Met      PT LONG TERM GOAL #3   Title Patient will be able to ambulate without AD or supervision for at least 1 hour with minimal breaks for modified return to work    Baseline Back at work for 4 days    Time 12    Period Weeks    Status Achieved      PT LONG TERM GOAL #4   Title Patient will demonstrate 4+or 5/5 for all RLE measuements for improved function of ADLs and leisure activities.    Baseline 4- to 4/5    Time 12    Period Weeks    Status On-going                   Plan - 05/22/20 0841     Clinical Impression Statement Mikayla Wiley continues to work as hard as possible with her HEP.  Her 6 day a week work schedule means she is squeezing in exercises throughout the day with emphasis on quadriceps strength and proprioception.  Continue current POC to reduce late day pain and feelings of instability.    Personal Factors and Comorbidities Profession    Examination-Activity Limitations Sit;Stairs;Stand;Locomotion Level  Examination-Participation Restrictions Occupation;Laundry    Stability/Clinical Decision Making Stable/Uncomplicated    Rehab Potential Excellent    PT Frequency 3x / week    PT  Duration 12 weeks    PT Treatment/Interventions ADLs/Self Care Home Management;Aquatic Therapy;Electrical Stimulation;Iontophoresis 70m/ml Dexamethasone;Moist Heat;Ultrasound;Cryotherapy;Neuromuscular re-education;Balance training;Therapeutic exercise;Therapeutic activities;Functional mobility training;Stair training;Gait training;Patient/family education;Manual techniques;Dry needling;Passive range of motion;Scar mobilization;Taping;Vasopneumatic Device;Joint Manipulations    PT Next Visit Plan progress flexion ROM and strength as able, eccentric quad control    PT Home Exercise Plan Access Code: [email or web address]   Consulted and Agree with Plan of Care Patient;Family member/caregiver    Family Member Consulted Husband             Patient will benefit from skilled therapeutic intervention in order to improve the following deficits and impairments:  Abnormal gait,Decreased coordination,Decreased range of motion,Difficulty walking,Decreased activity tolerance,Pain,Decreased balance,Decreased mobility,Decreased strength,Increased edema  Visit Diagnosis: Difficulty walking  Muscle weakness (generalized)  Stiffness of right knee, not elsewhere classified  Localized edema  Acute pain of right knee     Problem List Patient Active Problem List   Diagnosis Date Noted   Rupture of anterior cruciate ligament of right knee    Peripheral tear of lateral meniscus of right knee as current injury    S/P cervical spinal fusion 01/31/2014    RFarley LyPT, MPT 05/22/2020, 8:43 AM  PHYSICAL THERAPY DISCHARGE SUMMARY  Visits from Start of Care: 18  Current functional level related to goals / functional outcomes: See note   Remaining deficits: See note   Education / Equipment: HEP   Patient agrees to discharge. Patient goals were partially met. Patient is being discharged due to not returning since the last visit.  MScot Jun PT, DPT, OCS, ATC 06/22/20  10:29 AM     [facility] [address]GC[place] [address]Phone: [phone]  Fax:  [phone] Name: Mikayla ROLLOMRN: [id number] of Birth: 103-Nov-1972

## 2020-05-27 DIAGNOSIS — R0602 Shortness of breath: Secondary | ICD-10-CM | POA: Diagnosis not present

## 2020-05-27 DIAGNOSIS — I10 Essential (primary) hypertension: Secondary | ICD-10-CM | POA: Diagnosis not present

## 2020-05-27 DIAGNOSIS — R519 Headache, unspecified: Secondary | ICD-10-CM | POA: Diagnosis not present

## 2020-05-27 DIAGNOSIS — Z20822 Contact with and (suspected) exposure to covid-19: Secondary | ICD-10-CM | POA: Diagnosis not present

## 2020-05-27 DIAGNOSIS — R0789 Other chest pain: Secondary | ICD-10-CM | POA: Diagnosis not present

## 2020-05-27 DIAGNOSIS — M5459 Other low back pain: Secondary | ICD-10-CM | POA: Diagnosis not present

## 2020-05-27 DIAGNOSIS — R072 Precordial pain: Secondary | ICD-10-CM | POA: Diagnosis not present

## 2020-05-27 DIAGNOSIS — R079 Chest pain, unspecified: Secondary | ICD-10-CM | POA: Diagnosis not present

## 2020-05-28 DIAGNOSIS — R001 Bradycardia, unspecified: Secondary | ICD-10-CM | POA: Diagnosis not present

## 2020-05-29 ENCOUNTER — Encounter: Payer: BC Managed Care – PPO | Admitting: Physical Therapy

## 2020-05-29 ENCOUNTER — Encounter: Payer: Self-pay | Admitting: Physical Therapy

## 2020-06-24 ENCOUNTER — Ambulatory Visit (INDEPENDENT_AMBULATORY_CARE_PROVIDER_SITE_OTHER): Payer: BC Managed Care – PPO | Admitting: Orthopedic Surgery

## 2020-06-24 DIAGNOSIS — Z9889 Other specified postprocedural states: Secondary | ICD-10-CM

## 2020-06-27 ENCOUNTER — Encounter: Payer: Self-pay | Admitting: Orthopedic Surgery

## 2020-06-27 NOTE — Progress Notes (Signed)
Office Visit Note   Patient: Mikayla Wiley           Date of Birth: 1970-11-19           MRN: [id number] Visit Date: 06/24/2020 Requested by: [facility] [address] [place],  [place] [address] PCP: [facility] [place]  Subjective: Chief Complaint  Patient presents with   Right Knee - Routine Post Op    HPI: Mikayla Wiley is a 50 y.o. female who presents to the office s/p right knee ACL reconstruction on 02/10/2020.  She finished physical therapy on 05/22/2020.  She reports that she is doing well with occasional pain that comes and goes but overall is steadily improving.  She has occasional muscle spasms but that is her main complaint.  She is now able to go downstairs sequentially.  She is able to sit and stand at work and has been back to work for about a month now.  Denies any instability events.  She did have an episode of increased shortness of breath with dizziness and mild confusion last month while at work where she went to the emergency department and was evaluated.  She had elevated D-dimer but was negative for any pulmonary embolus.  She denies any significant severe shortness of breath recently but she does note she is a little bit more tired after a sending several flights of stairs then she is typically accustomed to.  She does have appointment to see cardiologist next week for further evaluation..                ROS: All systems reviewed are negative as they relate to the chief complaint within the history of present illness.  Patient denies fevers or chills.  Assessment & Plan: Visit Diagnoses:  1. S/P right knee arthroscopy     Plan: Patient is a 50 year old female who returns for evaluation of right knee s/p right knee ACL reconstruction on 02/10/2020.  Doing well following surgery and her pain is steadily improving.  Her function is improving as well to the point that she has been able to return to work in the last  month.  Appointment with cardiologist next week to evaluate some symptoms of shortness of breath that she was experiencing.  She appears well on exam today.  Excellent range of motion and strength of her knee and no instability events.  No laxity with Lachman exam today.  Most of what she wants to do is just walking and going to work and she has no plans for any higher intensity activities such as playing sports.  Given this, encourage patient to continue with home exercise program and follow-up with the office as needed if she has any concerns.  Follow-Up Instructions: No follow-ups on file.   Orders:  No orders of the defined types were placed in this encounter.  No orders of the defined types were placed in this encounter.     Procedures: No procedures performed   Clinical Data: No additional findings.  Objective: Vital Signs: There were no vitals taken for this visit.  Physical Exam:  Constitutional: Patient appears well-developed HEENT:  Head: Normocephalic Eyes:EOM are normal Neck: Normal range of motion Cardiovascular: Normal rate Pulmonary/chest: Effort normal Neurologic: Patient is alert Skin: Skin is warm Psychiatric: Patient has normal mood and affect  Ortho Exam: On exam patient has 0 degrees of knee extension with 125 degrees of the right knee flexion.  No effusion  present.  5 -/5 quadricep strength.  ACL is intact on Lachman exam and by anterior drawer.  Incisions are well-healed.  No significant calf tenderness, negative Homans' sign.  Mild medial joint line tenderness to palpation.  No difficulty performing straight leg raise.  Specialty Comments:  No specialty comments available.  Imaging: No results found.   PMFS History: Patient Active Problem List   Diagnosis Date Noted   Rupture of anterior cruciate ligament of right knee    Peripheral tear of lateral meniscus of right knee as current injury    S/P cervical spinal fusion 01/31/2014   Past Medical  History:  Diagnosis Date   COVID-19 12/2018   Headache    migrains    Family History  Problem Relation Age of Onset   Healthy Mother    Healthy Father     Past Surgical History:  Procedure Laterality Date   ANTERIOR CERVICAL DECOMP/DISCECTOMY FUSION N/A 01/31/2014   Procedure: C5-6 Anterior Cervical Discectomy and Fusion;  Surgeon: Eldred Manges, MD;  [facility];  Service: Orthopedics;  Laterality: N/A;   ANTERIOR CRUCIATE LIGAMENT REPAIR Right 02/20/2020   Procedure: RIGHT KNEE ANTERIOR CRUCIATE LIGAMENT (ACL) RECONSTRUCTION HAMSTRING AUTOGRAFT;  Surgeon: Cammy Copa, MD;  Location: [facility];  Service: Orthopedics;  Laterality: Right;   CESAREAN SECTION     KNEE ARTHROSCOPY Right 12/91   Social History   Occupational History   Not on file  Tobacco Use   Smoking status: Never   Smokeless tobacco: Never  Vaping Use   Vaping Use: Never used  Substance and Sexual Activity   Alcohol use: No   Drug use: No   Sexual activity: Not on file

## 2020-07-09 DIAGNOSIS — R079 Chest pain, unspecified: Secondary | ICD-10-CM | POA: Diagnosis not present

## 2020-09-03 DIAGNOSIS — R079 Chest pain, unspecified: Secondary | ICD-10-CM | POA: Diagnosis not present

## 2020-09-29 DIAGNOSIS — R0602 Shortness of breath: Secondary | ICD-10-CM | POA: Diagnosis not present

## 2020-12-04 DIAGNOSIS — Z01419 Encounter for gynecological examination (general) (routine) without abnormal findings: Secondary | ICD-10-CM | POA: Diagnosis not present

## 2021-01-19 DIAGNOSIS — R0602 Shortness of breath: Secondary | ICD-10-CM | POA: Diagnosis not present

## 2021-01-19 DIAGNOSIS — G4739 Other sleep apnea: Secondary | ICD-10-CM | POA: Diagnosis not present

## 2021-01-19 DIAGNOSIS — U099 Post covid-19 condition, unspecified: Secondary | ICD-10-CM | POA: Diagnosis not present

## 2021-01-22 ENCOUNTER — Emergency Department (HOSPITAL_BASED_OUTPATIENT_CLINIC_OR_DEPARTMENT_OTHER): Payer: BC Managed Care – PPO

## 2021-01-22 ENCOUNTER — Emergency Department (HOSPITAL_BASED_OUTPATIENT_CLINIC_OR_DEPARTMENT_OTHER)
Admission: EM | Admit: 2021-01-22 | Discharge: 2021-01-22 | Disposition: A | Discharge status: Home / Self Care | Payer: BC Managed Care – PPO | Attending: Emergency Medicine | Admitting: Emergency Medicine

## 2021-01-22 ENCOUNTER — Emergency Department (HOSPITAL_BASED_OUTPATIENT_CLINIC_OR_DEPARTMENT_OTHER): Payer: BC Managed Care – PPO | Admitting: Radiology

## 2021-01-22 ENCOUNTER — Encounter (HOSPITAL_BASED_OUTPATIENT_CLINIC_OR_DEPARTMENT_OTHER): Payer: Self-pay | Admitting: Emergency Medicine

## 2021-01-22 ENCOUNTER — Other Ambulatory Visit: Payer: Self-pay

## 2021-01-22 DIAGNOSIS — R9431 Abnormal electrocardiogram [ECG] [EKG]: Secondary | ICD-10-CM | POA: Diagnosis not present

## 2021-01-22 DIAGNOSIS — R0602 Shortness of breath: Secondary | ICD-10-CM | POA: Diagnosis not present

## 2021-01-22 DIAGNOSIS — Z8616 Personal history of COVID-19: Secondary | ICD-10-CM | POA: Insufficient documentation

## 2021-01-22 DIAGNOSIS — S20212A Contusion of left front wall of thorax, initial encounter: Secondary | ICD-10-CM | POA: Diagnosis not present

## 2021-01-22 DIAGNOSIS — R55 Syncope and collapse: Secondary | ICD-10-CM | POA: Diagnosis not present

## 2021-01-22 DIAGNOSIS — Y92002 Bathroom of unspecified non-institutional (private) residence single-family (private) house as the place of occurrence of the external cause: Secondary | ICD-10-CM | POA: Insufficient documentation

## 2021-01-22 DIAGNOSIS — R9389 Abnormal findings on diagnostic imaging of other specified body structures: Secondary | ICD-10-CM

## 2021-01-22 DIAGNOSIS — R200 Anesthesia of skin: Secondary | ICD-10-CM | POA: Insufficient documentation

## 2021-01-22 DIAGNOSIS — S299XXA Unspecified injury of thorax, initial encounter: Secondary | ICD-10-CM | POA: Diagnosis not present

## 2021-01-22 DIAGNOSIS — M542 Cervicalgia: Secondary | ICD-10-CM | POA: Diagnosis not present

## 2021-01-22 DIAGNOSIS — W182XXA Fall in (into) shower or empty bathtub, initial encounter: Secondary | ICD-10-CM | POA: Insufficient documentation

## 2021-01-22 DIAGNOSIS — R0781 Pleurodynia: Secondary | ICD-10-CM | POA: Diagnosis not present

## 2021-01-22 DIAGNOSIS — R0789 Other chest pain: Secondary | ICD-10-CM | POA: Diagnosis not present

## 2021-01-22 DIAGNOSIS — J45909 Unspecified asthma, uncomplicated: Secondary | ICD-10-CM | POA: Insufficient documentation

## 2021-01-22 DIAGNOSIS — Z043 Encounter for examination and observation following other accident: Secondary | ICD-10-CM | POA: Diagnosis not present

## 2021-01-22 LAB — CBC WITH DIFFERENTIAL/PLATELET
Abs Immature Granulocytes: 0.02 10*3/uL (ref 0.00–0.07)
Basophils Absolute: 0 10*3/uL (ref 0.0–0.1)
Basophils Relative: 0 %
Eosinophils Absolute: 0 10*3/uL (ref 0.0–0.5)
Eosinophils Relative: 0 %
HCT: 39.7 % (ref 36.0–46.0)
Hemoglobin: 13 g/dL (ref 12.0–15.0)
Immature Granulocytes: 0 %
Lymphocytes Relative: 31 %
Lymphs Abs: 1.6 10*3/uL (ref 0.7–4.0)
MCH: 29.8 pg (ref 26.0–34.0)
MCHC: 32.7 g/dL (ref 30.0–36.0)
MCV: 91.1 fL (ref 80.0–100.0)
Monocytes Absolute: 0.3 10*3/uL (ref 0.1–1.0)
Monocytes Relative: 6 %
Neutro Abs: 3.2 10*3/uL (ref 1.7–7.7)
Neutrophils Relative %: 63 %
Platelets: 243 10*3/uL (ref 150–400)
RBC: 4.36 MIL/uL (ref 3.87–5.11)
RDW: 13 % (ref 11.5–15.5)
WBC: 5.1 10*3/uL (ref 4.0–10.5)
nRBC: 0 % (ref 0.0–0.2)

## 2021-01-22 LAB — URINALYSIS, ROUTINE W REFLEX MICROSCOPIC
Bilirubin Urine: NEGATIVE
Glucose, UA: NEGATIVE mg/dL
Hgb urine dipstick: NEGATIVE
Ketones, ur: NEGATIVE mg/dL
Leukocytes,Ua: NEGATIVE
Nitrite: NEGATIVE
Specific Gravity, Urine: >1.046 — ABNORMAL HIGH (ref 1.005–1.030)
pH: 6.5 (ref 5.0–8.0)

## 2021-01-22 LAB — BRAIN NATRIURETIC PEPTIDE: B Natriuretic Peptide: 21.9 pg/mL (ref 0.0–100.0)

## 2021-01-22 LAB — COMPREHENSIVE METABOLIC PANEL
ALT: 10 U/L (ref 0–44)
AST: 15 U/L (ref 15–41)
Albumin: 4.3 g/dL (ref 3.5–5.0)
Alkaline Phosphatase: 85 U/L (ref 38–126)
Anion gap: 9 (ref 5–15)
BUN: 12 mg/dL (ref 6–20)
CO2: 25 mmol/L (ref 22–32)
Calcium: 9.6 mg/dL (ref 8.9–10.3)
Chloride: 107 mmol/L (ref 98–111)
Creatinine, Ser: 1.13 mg/dL — ABNORMAL HIGH (ref 0.44–1.00)
GFR, Estimated: 59 mL/min — ABNORMAL LOW (ref >60)
Glucose, Bld: 97 mg/dL (ref 70–99)
Potassium: 4.2 mmol/L (ref 3.5–5.1)
Sodium: 141 mmol/L (ref 135–145)
Total Bilirubin: 0.5 mg/dL (ref 0.3–1.2)
Total Protein: 7.4 g/dL (ref 6.5–8.1)

## 2021-01-22 LAB — D-DIMER, QUANTITATIVE: D-Dimer, Quant: 0.91 ug/mL-FEU — ABNORMAL HIGH (ref 0.00–0.50)

## 2021-01-22 LAB — TROPONIN I (HIGH SENSITIVITY)
Troponin I (High Sensitivity): <2 ng/L (ref <18)
Troponin I (High Sensitivity): 3 ng/L (ref <18)

## 2021-01-22 MED ORDER — IBUPROFEN 600 MG PO TABS: 600.0000 mg | ORAL_TABLET | Freq: Four times a day (QID) | ORAL | 0 refills | Status: DC | PRN

## 2021-01-22 MED ORDER — MORPHINE SULFATE (PF) 4 MG/ML IV SOLN
4.0000 mg | Freq: Once | INTRAVENOUS | Status: AC
  Administered 2021-01-22: 4 mg via INTRAVENOUS
  Filled 2021-01-22: qty 1

## 2021-01-22 MED ORDER — ONDANSETRON HCL 4 MG/2ML IJ SOLN
4.0000 mg | Freq: Once | INTRAMUSCULAR | Status: AC
  Administered 2021-01-22: 4 mg via INTRAVENOUS
  Filled 2021-01-22: qty 2

## 2021-01-22 MED ORDER — METHOCARBAMOL 500 MG PO TABS: 1000.0000 mg | ORAL_TABLET | Freq: Two times a day (BID) | ORAL | 0 refills | Status: AC

## 2021-01-22 MED ORDER — HYDROCODONE-ACETAMINOPHEN 5-325 MG PO TABS
1.0000 | ORAL_TABLET | Freq: Once | ORAL | Status: AC
  Administered 2021-01-22: 1 via ORAL
  Filled 2021-01-22: qty 1

## 2021-01-22 MED ORDER — LIDOCAINE 5 % EX PTCH
1.0000 | MEDICATED_PATCH | CUTANEOUS | Status: DC
  Administered 2021-01-22: 1 via TRANSDERMAL
  Filled 2021-01-22: qty 1

## 2021-01-22 MED ORDER — OXYCODONE-ACETAMINOPHEN 5-325 MG PO TABS
1.0000 | ORAL_TABLET | Freq: Four times a day (QID) | ORAL | 0 refills | Status: DC | PRN
Start: 2021-01-22 — End: 2021-12-09

## 2021-01-22 MED ORDER — LIDOCAINE 5 % EX PTCH: 1.0000 | MEDICATED_PATCH | Freq: Every day | CUTANEOUS | 0 refills | Status: DC | PRN

## 2021-01-22 MED ORDER — IOHEXOL 350 MG/ML SOLN
100.0000 mL | Freq: Once | INTRAVENOUS | Status: AC | PRN
  Administered 2021-01-22: 100 mL via INTRAVENOUS

## 2021-01-22 MED ORDER — ACETAMINOPHEN 325 MG PO TABS: 650.0000 mg | ORAL_TABLET | Freq: Four times a day (QID) | ORAL | 0 refills | Status: DC | PRN

## 2021-01-22 NOTE — ED Triage Notes (Addendum)
Pt presents to ED after falling this morning. Pt reports she started a new Breo inhaler, while standing in her bathroom she inhaled on her inhaler, held her breath and then everything went black, pt doesn't remember falling. Spouse was in next room noticed pt started leaning to her Left, fell landing on the bathtub hitting her Left side. Spouse reports he was yelling her name but she wasn't responding. Pt could hear her husband but was unable to speak, next thing she remembers was her husband standing over the top of her. Pt c/o intense pain yo her Left lateral ribs, pain increases with breathing, unable to take in a full breath. Pt denies hitting her head, denies N/V

## 2021-01-22 NOTE — ED Notes (Signed)
Pt states that at rest her left ribs pain is a 2 when exertion it jumps to 8

## 2021-01-22 NOTE — Discharge Instructions (Addendum)
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  Have someone stay with you until you feel stable. Do not drive, operate machinery, or play sports until your caregiver says it is okay. Keep all follow-up appointments as directed by your caregiver. Lie down right away if you start feeling like you might faint. Breathe deeply and steadily. Wait until all the symptoms have passed.Drink enough fluids to keep your urine clear or pale yellow. If you are taking blood pressure or heart medicine, get up slowly, taking several minutes to sit and then stand. This can reduce dizziness. SEEK IMMEDIATE MEDICAL CARE IF: You have a severe headache. You have unusual pain in the chest, abdomen, or back. You are bleeding from the mouth or rectum, or you have a black or tarry stool. You have an irregular or very fast heartbeat. You have pain with breathing. You have repeated fainting or seizure-like jerking during an episode. You faint when sitting or lying down. You have confusion. You have difficulty walking. You have severe weakness. You have vision problems. If you fainted, call your local emergency services - do not drive yourself to the hospital.   Please return to the emergency department immediately for any new or concerning symptoms, or if you get worse.  Please return to the emergency department for any worsening or worrisome symptoms.

## 2021-01-22 NOTE — ED Provider Notes (Signed)
[facility] Provider Note   CSN: [id number] Arrival date & time: 01/22/21  [id number]     History  Chief Complaint  Patient presents with   [facility]    M[facility] is a 51 y.o. female.  This is a 51 y.o. female with significant medical history as below, including asthma who presents to the ED with complaint of LOC, left-sided rib pain.  Patient reports she did recently start using her Breo Ellipta inhaler.  She was in the restroom this morning using her inhaler as she was instructed.  Use the inhaler, took a deep breath and held her breath.  While she was counting in her head regarding her breath-hold she had LOC.  Patient fell onto her left side onto the bathtub adjacent to where she was standing.  Immediate pain to her left side chest wall, difficulty breathing secondary to the discomfort.  Not believe she hit her head.  She is having some mild neck discomfort with turning.  No thinners.  No chest pain prior to the fall, no palpitations, dyspnea prior to the fall.  Following the fall patient did not have loss of bowel or bladder control, no nausea or vomiting.  She did take a Tylenol help with her discomfort.  She was ambulatory after the incident.  After the fall she requested that her spouse drive her to work, she felt lightheaded/woozy, having worsening discomfort to her left-sided chest wall and came to the ER for evaluation.  She has paresthesias to her fingertips distally b/l that has been ongoing prior to the fall.  No history of recurrent syncope.  Hx c-spine surgery 2016 Surgeon: Marybelle Killings, MD;   Past Medical History: 12/2018: COVID-19 No date: Headache     Comment:  migrains     The history is provided by the patient and a relative. No language interpreter was used.      Home Medications Prior to Admission medications   Medication Sig Start Date End Date Taking? Authorizing Provider  acetaminophen (TYLENOL) 325 MG tablet Take 2 tablets (650  mg total) by mouth every 6 (six) hours as needed. 01/22/21  Yes Jeanell Sparrow, DO  acetaminophen (TYLENOL) 500 MG tablet Take 1,000 mg by mouth every 6 (six) hours as needed for mild pain.   Yes [provider]  albuterol (VENTOLIN HFA) 108 (90 Base) MCG/ACT inhaler Inhale 1-2 puffs into the lungs every 6 (six) hours as needed for wheezing or shortness of breath. 02/01/20  Yes Covington, Sarah M, PA-C  BREO ELLIPTA 100-25 MCG/ACT AEPB Inhale 1 puff into the lungs daily. 01/19/21  Yes [provider]  ibuprofen (ADVIL) 600 MG tablet Take 1 tablet (600 mg total) by mouth every 6 (six) hours as needed. 01/22/21  Yes Wynona Dove A, DO  ibuprofen (ADVIL) 800 MG tablet Take 1 tablet (800 mg total) by mouth daily as needed for mild pain or moderate pain. 05/21/20  Yes Magnant, Charles L, PA-C  lidocaine (LIDODERM) 5 % Place 1 patch onto the skin daily as needed. Remove & Discard patch within 12 hours or as directed by MD 01/22/21  Yes Jeanell Sparrow, DO  methocarbamol (ROBAXIN) 500 MG tablet Take 1 tablet (500 mg total) by mouth every 8 (eight) hours as needed. 02/20/20  Yes Magnant, Charles L, PA-C  methocarbamol (ROBAXIN) 500 MG tablet Take 2 tablets (1,000 mg total) by mouth 2 (two) times daily for 5 days. 01/22/21 01/27/21 Yes Jeanell Sparrow, DO  oxyCODONE-acetaminophen (  PERCOCET/ROXICET) 5-325 MG tablet Take 1 tablet by mouth every 6 (six) hours as needed for severe pain. 01/22/21  Yes Sloan Leiter, DO      Allergies    Patient has no known allergies.    Review of Systems   Review of Systems  Constitutional:  Negative for chills and fever.  HENT:  Negative for facial swelling and trouble swallowing.   Eyes:  Negative for photophobia and visual disturbance.  Respiratory:  Positive for shortness of breath. Negative for cough.   Cardiovascular:  Positive for chest pain. Negative for palpitations.  Gastrointestinal:  Negative for abdominal pain, nausea and vomiting.  Endocrine: Negative  for polydipsia and polyuria.  Genitourinary:  Negative for difficulty urinating and hematuria.  Musculoskeletal:  Negative for gait problem and joint swelling.  Skin:  Negative for pallor and rash.  Neurological:  Positive for syncope and light-headedness. Negative for headaches.  Psychiatric/Behavioral:  Negative for agitation and confusion.    Physical Exam Updated Vital Signs BP 125/78    Pulse (!) 56    Temp 97.9 F (36.6 C) (Tympanic)    Resp 17    Ht 5\' 7"  (1.702 m)    Wt 90.3 kg    SpO2 100%    BMI 31.17 kg/m  Physical Exam Vitals and nursing note reviewed.  Constitutional:      General: She is not in acute distress.    Appearance: Normal appearance. She is not ill-appearing, toxic-appearing or diaphoretic.  HENT:     Head: Normocephalic and atraumatic. No raccoon eyes, Battle's sign, right periorbital erythema or left periorbital erythema.     Right Ear: External ear normal.     Left Ear: External ear normal.     Nose: Nose normal.     Mouth/Throat:     Mouth: Mucous membranes are moist.  Eyes:     General: No scleral icterus.       Right eye: No discharge.        Left eye: No discharge.     Extraocular Movements: Extraocular movements intact.     Pupils: Pupils are equal, round, and reactive to light.  Cardiovascular:     Rate and Rhythm: Normal rate and regular rhythm.     Pulses: Normal pulses.     Heart sounds: Normal heart sounds.  Pulmonary:     Effort: Pulmonary effort is normal. No tachypnea, accessory muscle usage or respiratory distress.     Breath sounds: Normal breath sounds.  Chest:    Abdominal:     General: Abdomen is flat.     Tenderness: There is no abdominal tenderness.  Musculoskeletal:        General: Normal range of motion.     Cervical back: Normal range of motion.     Right lower leg: No edema.     Left lower leg: No edema.     Comments: No midline spinous process tenderness  Skin:    General: Skin is warm and dry.     Capillary  Refill: Capillary refill takes less than 2 seconds.  Neurological:     Mental Status: She is alert and oriented to person, place, and time. Mental status is at baseline.     GCS: GCS eye subscore is 4. GCS verbal subscore is 5. GCS motor subscore is 6.     Cranial Nerves: Cranial nerves 2-12 are intact. No dysarthria or facial asymmetry.     Sensory: Sensation is intact.     Motor: Motor  function is intact. No tremor.     Coordination: Coordination is intact.     Gait: Gait is intact.  Psychiatric:        Mood and Affect: Mood normal.        Behavior: Behavior normal.    ED Results / Procedures / Treatments   Labs (all labs ordered are listed, but only abnormal results are displayed) Labs Reviewed  COMPREHENSIVE METABOLIC PANEL - Abnormal; Notable for the following components:      Result Value   Creatinine, Ser 1.13 (*)    GFR, Estimated 59 (*)    All other components within normal limits  URINALYSIS, ROUTINE W REFLEX MICROSCOPIC - Abnormal; Notable for the following components:   Color, Urine COLORLESS (*)    Specific Gravity, Urine >1.046 (*)    Protein, ur TRACE (*)    All other components within normal limits  D-DIMER, QUANTITATIVE - Abnormal; Notable for the following components:   D-Dimer, Quant 0.91 (*)    All other components within normal limits  CBC WITH DIFFERENTIAL/PLATELET  BRAIN NATRIURETIC PEPTIDE  CBG MONITORING, ED  TROPONIN I (HIGH SENSITIVITY)  TROPONIN I (HIGH SENSITIVITY)    EKG EKG Interpretation  Date/Time:  Friday January 22 2021 08:52:02 EST Ventricular Rate:  65 PR Interval:  145 QRS Duration: 78 QT Interval:  397 QTC Calculation: 413 R Axis:   56 Text Interpretation: Sinus rhythm similar to prior no stemi Confirmed by Wynona Dove ([id number]) on 01/22/2021 8:55:17 AM  Radiology DG Ribs Unilateral W/Chest Left  Result Date: 01/22/2021 CLINICAL DATA:  [place] this morning, syncopal episode while using her inhaler, does not remember falling but fell  onto LEFT side onto the tub, LEFT lateral rib pain EXAM: LEFT RIBS AND CHEST - 3+ VIEW COMPARISON:  05/27/2020 chest radiographs FINDINGS: Normal heart size, mediastinal contours, and pulmonary vascularity. Lungs clear. No pulmonary infiltrate, pleural effusion, or pneumothorax. Osseous mineralization normal. No rib fracture or bone destruction. IMPRESSION: No acute abnormalities. Electronically Signed   By: Lavonia Dana M.D.   On: 01/22/2021 09:39   CT Head Wo Contrast  Result Date: 01/22/2021 CLINICAL DATA:  51 year old female status post fall in bathroom. EXAM: CT HEAD WITHOUT CONTRAST TECHNIQUE: Contiguous axial images were obtained from the base of the skull through the vertex without intravenous contrast. RADIATION DOSE REDUCTION: This exam was performed according to the departmental dose-optimization program which includes automated exposure control, adjustment of the mA and/or kV according to patient size and/or use of iterative reconstruction technique. COMPARISON:  None. FINDINGS: Brain: Cerebral volume is within normal limits. No midline shift, ventriculomegaly, mass effect, evidence of mass lesion, intracranial hemorrhage or evidence of cortically based acute infarction. Petrice Beedy-white matter differentiation is within normal limits throughout the brain. Vascular: No suspicious intracranial vascular hyperdensity. Skull: Intact, negative. Sinuses/Orbits: Visualized paranasal sinuses and mastoids are clear. Other: No orbit or scalp soft tissue injury identified. IMPRESSION: Normal noncontrast CT appearance of the brain. No acute traumatic injury identified. Electronically Signed   By: Genevie Ann M.D.   On: 01/22/2021 09:43   CT Angio Chest PE W and/or Wo Contrast  Result Date: 01/22/2021 CLINICAL DATA:  Shortness of breath, trauma. EXAM: CT ANGIOGRAPHY CHEST WITH CONTRAST TECHNIQUE: Multidetector CT imaging of the chest was performed using the standard protocol during bolus administration of intravenous  contrast. Multiplanar CT image reconstructions and MIPs were obtained to evaluate the vascular anatomy. RADIATION DOSE REDUCTION: This exam was performed according to the departmental dose-optimization program which includes automated  exposure control, adjustment of the mA and/or kV according to patient size and/or use of iterative reconstruction technique. CONTRAST:  148mL OMNIPAQUE IOHEXOL 350 MG/ML SOLN COMPARISON:  CTA chest 05/27/2020 FINDINGS: Cardiovascular: No pulmonary embolism identified. Main pulmonary artery is normal caliber. Heart size is normal. No pericardial effusion identified. There is a 1.9 x 1.4 cm well-circumscribed right pericardial density measuring 38 Hounsfield units, which is not significantly changed in size since previous study. Thoracic aorta is normal caliber. Mediastinum/Nodes: No bulky axillary, mediastinal or hilar lymphadenopathy identified. Lungs/Pleura: Lungs are clear. No pleural effusion or pneumothorax. Upper Abdomen: No acute abnormality. Musculoskeletal: No chest wall abnormality. No acute or significant osseous findings. Review of the MIP images confirms the above findings. IMPRESSION: 1. No pulmonary embolism or acute intrathoracic process identified. 2. 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 Consider follow-up noncontrast CT in 6 months to ensure stability. Electronically Signed   By: Ofilia Neas M.D.   On: 01/22/2021 12:04   CT Cervical Spine Wo Contrast  Result Date: 01/22/2021 CLINICAL DATA:  51 year old female status post fall in bathroom. EXAM: CT CERVICAL SPINE WITHOUT CONTRAST TECHNIQUE: Multidetector CT imaging of the cervical spine was performed without intravenous contrast. Multiplanar CT image reconstructions were also generated. RADIATION DOSE REDUCTION: This exam was performed according to the departmental dose-optimization program which includes  automated exposure control, adjustment of the mA and/or kV according to patient size and/or use of iterative reconstruction technique. COMPARISON:  Head CT today reported separately. Cervical spine MRI 10/03/2013. FINDINGS: Alignment: Preserved cervical lordosis. Cervicothoracic junction alignment is within normal limits. Bilateral posterior element alignment is within normal limits. Skull base and vertebrae: Visualized skull base is intact. No atlanto-occipital dissociation. C1 and C2 appear intact and aligned. No acute osseous abnormality identified. Soft tissues and spinal canal: No prevertebral fluid or swelling. No visible canal hematoma. Negative noncontrast visible neck soft tissues. Disc levels: Previous C5-C6 ACDF with solid arthrodesis. Disc space loss and endplate spurring at D34-534 since 2015. Suggestion of broad-based posterior disc protrusion on series 4, image 54 with mild spinal stenosis and mild cord mass effect. Upper chest: Visible upper thoracic levels appear intact. Negative lung apices. IMPRESSION: 1. No acute traumatic injury identified in the cervical spine. 2. Previous C5-C6 ACDF with solid arthrodesis. 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 Electronically Signed   By: Genevie Ann M.D.   On: 01/22/2021 09:46    Procedures Procedures    Medications Ordered in ED Medications  morphine 4 MG/ML injection 4 mg (4 mg Intravenous Given 01/22/21 0958)  ondansetron (ZOFRAN) injection 4 mg (4 mg Intravenous Given 01/22/21 0958)  iohexol (OMNIPAQUE) 350 MG/ML injection 100 mL (100 mLs Intravenous Contrast Given 01/22/21 1118)  HYDROcodone-acetaminophen (NORCO/VICODIN) 5-325 MG per tablet 1 tablet (1 tablet Oral Given 01/22/21 1506)    ED Course/ Medical Decision Making/ A&P Clinical Course as of 01/23/21 1308  Fri Jan 22, 2021  1030 CT cervical "Disc levels: Previous C5-C6 ACDF with solid arthrodesis. Disc space loss  and endplate spurring at D34-534 since 2015. Suggestion of broad-based posterior disc protrusion on series 4, image 54 with mild spinal stenosis and mild cord mass effect."  She has no acute neuro deficits, will d/w NSGY who recommends she f/u with Dr Lorin Mercy in the office. No  [SG]  1419 Follow up CT in 6 most for pericardial cyst [SG]  Clinical Course User Index [SG] Jeanell Sparrow, DO                           Medical Decision Making Amount and/or Complexity of Data Reviewed Independent Historian: spouse External Data Reviewed: labs, radiology and notes. Labs: ordered. Decision-making details documented in ED Course. Radiology: ordered and independent interpretation performed. Decision-making details documented in ED Course. ECG/medicine tests: ordered and independent interpretation performed. Decision-making details documented in ED Course.  Risk OTC drugs. Prescription drug management.    CC: Fall, syncope  This patient complains of above; this involves an extensive number of treatment options and is a complaint that carries with it a high risk of complications and morbidity. Vital signs were reviewed. Serious etiologies considered.  Record review:  Previous records obtained and reviewed   Additional history obtained from family at bedside  Work up as above, notable for:  Labs & imaging results that were available during my care of the patient were reviewed by me and considered in my medical decision making.   I ordered imaging studies which included CT head, cervical spine, x-ray left ribs chest and I independently visualized and interpreted imaging which showed CT head was negative, CT cervical spine shows demonstration of heart surgery.  Possible mild cord mass-effect.  Will d/w NSGY. Rib series was negative for fracture.  Favor likely rib contusion.  Patient with abnormal CT cervical spine imaging, discussed with neurosurgery.  Patient is having some mild paresthesias  b/l fingertips.  Not significant worsened since the fall.  No focal neurodeficits.  Cardiac monitoring reviewed and interpreted personally which shows normal sinus rhythm  Social determinants of health include - n/a  EKG reviewed, normal sinus rhythm.  Cardiac monitoring normal sinus rhythm. Trop negative. ACS unlikely.   [place] syncope low risk  Management: Patient given analgesics, incentive spirometer  Reassessment:  Pt feeling better, ambulatory w/ steady gait  RT discussed the patient regarding her home inhaler use.  Advised pt to f/u with pulmonary regarding inhaler.  F/u w/ pcp regarding pericardial cyst  Patient presents with syncopal symptoms without worrisome features. Presentation most suggestive of neuro-cardiogenic or orthostatic cause. Very low suspicion for serious arrhythmia, cardiac ischemia or other serious etiology. ECG reviewed, no evidence of a cardiac arrhythmia such as [place], WPW, HOCM, IHSS, Long or short QT. Neurologic exam is nonfocal, not consistent with CVA or primary neurologic abnormality. Patient appears safe for discharge with outpatient observation and close PCP F/U. Syncope warnings discussed with patient. The patient has been instructed to return immediately if the symptoms worsen in any way. Patient verbalized understanding and is in agreement with current care plan. All questions answered prior to discharge.         This chart was dictated using voice recognition software.  Despite best efforts to proofread,  errors can occur which can change the documentation meaning.         Final Clinical Impression(s) / ED Diagnoses Final diagnoses:  Syncope, unspecified syncope type  Contusion of rib on left side, initial encounter  Abnormal CT scan    Rx / DC Orders ED Discharge Orders          Ordered    oxyCODONE-acetaminophen (PERCOCET/ROXICET) 5-325 MG tablet  Every 6 hours PRN        01/22/21 1455    methocarbamol (ROBAXIN)  500 MG tablet  2 times daily        01/22/21 1455  lidocaine (LIDODERM) 5 %  Daily PRN        01/22/21 1455    acetaminophen (TYLENOL) 325 MG tablet  Every 6 hours PRN        01/22/21 1455    ibuprofen (ADVIL) 600 MG tablet  Every 6 hours PRN        01/22/21 1455              Jeanell Sparrow, DO 01/23/21 1309

## 2021-02-05 ENCOUNTER — Ambulatory Visit (INDEPENDENT_AMBULATORY_CARE_PROVIDER_SITE_OTHER): Payer: BC Managed Care – PPO | Admitting: Pulmonary Disease

## 2021-02-05 ENCOUNTER — Other Ambulatory Visit: Payer: Self-pay

## 2021-02-05 ENCOUNTER — Encounter: Payer: Self-pay | Admitting: Pulmonary Disease

## 2021-02-05 VITALS — BP 124/70 | HR 82 | Temp 99.0°F | Ht 67.0 in | Wt 207.2 lb

## 2021-02-05 DIAGNOSIS — R0600 Dyspnea, unspecified: Secondary | ICD-10-CM | POA: Diagnosis not present

## 2021-02-05 MED ORDER — BUDESONIDE-FORMOTEROL FUMARATE 160-4.5 MCG/ACT IN AERO: 2.0000 | INHALATION_SPRAY | Freq: Two times a day (BID) | RESPIRATORY_TRACT | 5 refills | Status: DC

## 2021-02-05 NOTE — Patient Instructions (Addendum)
We will get some labs today including IgE Continue the albuterol inhaler.  We will start you on an inhaler called Symbicort 160 Schedule PFTs and follow-up in clinic after these tests

## 2021-02-05 NOTE — Progress Notes (Signed)
Mikayla Wiley    [id number]    [birth year]  Primary Care Physician:[facility]  Referring Physician: Faustino Congress, NP [address] [place],  [place] [address]  Chief complaint:   Consult for asthma  HPI: 51 year old with history of allergies, migraine Developed COVID-19 in December 2020 and January 2022.  Since January she has been reporting increased shortness of breath, chest congestion, occasional wheeze Recently started on Breo inhaler by her primary care.  On the third day of using when she took a deep breath and held it she had a syncopal episode and fell in the bathtub.  Evaluated in the emergency room with CTA which was normal.  Breo was held and she was instructed to follow-up with pulmonary  Chief complaint is dyspnea on exertion at rest.  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  No feather pillows or comforters Smoking history: Never smoker Travel history: No travel history Relevant family history: Grandmother had lung cancer.  She was a smoker.  Outpatient Encounter Medications as of 02/05/2021  Medication Sig   acetaminophen (TYLENOL) 325 MG tablet Take 2 tablets (650 mg total) by mouth every 6 (six) hours as needed.   albuterol (VENTOLIN HFA) 108 (90 Base) MCG/ACT inhaler Inhale 1-2 puffs into the lungs every 6 (six) hours as needed for wheezing or shortness of breath.   ibuprofen (ADVIL) 600 MG tablet Take 1 tablet (600 mg total) by mouth every 6 (six) hours as needed.   ibuprofen (ADVIL) 800 MG tablet Take 1 tablet (800 mg total) by mouth daily as needed for mild pain or moderate pain.   lidocaine (LIDODERM) 5 % Place 1 patch onto the skin daily as needed. Remove & Discard patch within 12 hours or as directed by MD   methocarbamol (ROBAXIN) 500 MG tablet Take 1 tablet (500 mg total) by mouth every 8 (eight) hours as needed.    oxyCODONE-acetaminophen (PERCOCET/ROXICET) 5-325 MG tablet Take 1 tablet by mouth every 6 (six) hours as needed for severe pain.   acetaminophen (TYLENOL) 500 MG tablet Take 1,000 mg by mouth every 6 (six) hours as needed for mild pain. (Patient not taking: Reported on 02/05/2021)   BREO ELLIPTA 100-25 MCG/ACT AEPB Inhale 1 puff into the lungs daily. (Patient not taking: Reported on 02/05/2021)   No facility-administered encounter medications on file as of 02/05/2021.    Allergies as of 02/05/2021   (No Known Allergies)    Past Medical History:  Diagnosis Date   COVID-19 12/2018   Headache    migrains    Past Surgical History:  Procedure Laterality Date   ANTERIOR CERVICAL DECOMP/DISCECTOMY FUSION N/A 01/31/2014   Procedure: C5-6 Anterior Cervical Discectomy and Fusion;  Surgeon: Marybelle Killings, MD;  Location: [facility];  Service: Orthopedics;  Laterality: N/A;   ANTERIOR CRUCIATE LIGAMENT REPAIR Right 02/20/2020   Procedure: RIGHT KNEE ANTERIOR CRUCIATE LIGAMENT (ACL) RECONSTRUCTION HAMSTRING AUTOGRAFT;  Surgeon: Meredith Pel, MD;  Location: [facility];  Service: Orthopedics;  Laterality: Right;   CESAREAN SECTION     KNEE ARTHROSCOPY Right 12/91    Family History  Problem Relation Age of Onset   Healthy Mother    Healthy Father     Social History   Socioeconomic History   Marital status: Divorced    Spouse name: Not on file   Number of children: Not on file  Years of education: Not on file   Highest education level: Not on file  Occupational History   Not on file  Tobacco Use   Smoking status: Never   Smokeless tobacco: Never  Vaping Use   Vaping Use: Never used  Substance and Sexual Activity   Alcohol use: No   Drug use: No   Sexual activity: Not on file  Other Topics Concern   Not on file  Social History Narrative   Not on file   Social Determinants of Health   Financial Resource Strain: Not on file  Food Insecurity: Not on file  Transportation Needs: Not on  file  Physical Activity: Not on file  Stress: Not on file  Social Connections: Not on file  Intimate Partner Violence: Not on file    Review of systems: Review of Systems  Constitutional: Negative for fever and chills.  HENT: Negative.   Eyes: Negative for blurred vision.  Respiratory: as per HPI  Cardiovascular: Negative for chest pain and palpitations.  Gastrointestinal: Negative for vomiting, diarrhea, blood per rectum. Genitourinary: Negative for dysuria, urgency, frequency and hematuria.  Musculoskeletal: Negative for myalgias, back pain and joint pain.  Skin: Negative for itching and rash.  Neurological: Negative for dizziness, tremors, focal weakness, seizures and loss of consciousness.  Endo/Heme/Allergies: Negative for environmental allergies.  Psychiatric/Behavioral: Negative for depression, suicidal ideas and hallucinations.  All other systems reviewed and are negative.  Physical Exam: Blood pressure 124/70, pulse 82, temperature 99 F (37.2 C), temperature source Oral, height 5\' 7"  (1.702 m), weight 207 lb 3.2 oz (94 kg), SpO2 98 %. Gen:      No acute distress HEENT:  EOMI, sclera anicteric Neck:     No masses; no thyromegaly Lungs:    Clear to auscultation bilaterally; normal respiratory effort CV:         Regular rate and rhythm; no murmurs Abd:      + bowel sounds; soft, non-tender; no palpable masses, no distension Ext:    No edema; adequate peripheral perfusion Skin:      Warm and dry; no rash Neuro: alert and oriented x 3 Psych: normal mood and affect  Data Reviewed: Imaging: CT angiogram 01/22/2021-no PE, pericardial density-likely pericardial cyst.  No acute lung abnormality.  I have reviewed the images personally.  PFTs:  Labs: CBC 01/22/2021-WBC 5.1, eos 0%  Assessment:  Assessment for dyspnea Symptoms started after COVID infection I have reviewed her recent CT angiogram with no significant lung interstitial abnormalities.  She has underlying  allergies and may have reactive airway disease worsened by COVID-19  We will hold breo as she had a syncopal episode while using it Continue albuterol and attempt Symbicort with spacer Schedule PFTs, check IgE   Plan/Recommendations: Check IgE Continue albuterol, start Symbicort PFTs  Marshell Garfinkel MD [facility] 02/05/2021, 3:25 PM  CC: Faustino Congress, NP

## 2021-02-08 LAB — IGE: IgE (Immunoglobulin E), Serum: 12 kU/L (ref <=114)

## 2021-02-18 ENCOUNTER — Ambulatory Visit (INDEPENDENT_AMBULATORY_CARE_PROVIDER_SITE_OTHER): Payer: BC Managed Care – PPO | Admitting: Pulmonary Disease

## 2021-02-18 ENCOUNTER — Other Ambulatory Visit: Payer: Self-pay

## 2021-02-18 DIAGNOSIS — R0600 Dyspnea, unspecified: Secondary | ICD-10-CM | POA: Diagnosis not present

## 2021-02-18 LAB — PULMONARY FUNCTION TEST
DL/VA % pred: 109 %
DL/VA: 4.58 ml/min/mmHg/L
DLCO cor % pred: 71 %
DLCO cor: 16.55 ml/min/mmHg
DLCO unc % pred: 70 %
DLCO unc: 16.34 ml/min/mmHg
FEF 25-75 Post: 2.42 L/sec
FEF 25-75 Pre: 1.98 L/sec
FEF2575-%Change-Post: 22 %
FEF2575-%Pred-Post: 93 %
FEF2575-%Pred-Pre: 76 %
FEV1-%Change-Post: 8 %
FEV1-%Pred-Post: 79 %
FEV1-%Pred-Pre: 73 %
FEV1-Post: 2.03 L
FEV1-Pre: 1.87 L
FEV1FVC-%Change-Post: 1 %
FEV1FVC-%Pred-Pre: 102 %
FEV6-%Change-Post: 6 %
FEV6-%Pred-Post: 77 %
FEV6-%Pred-Pre: 72 %
FEV6-Post: 2.42 L
FEV6-Pre: 2.26 L
FEV6FVC-%Pred-Post: 102 %
FEV6FVC-%Pred-Pre: 102 %
FVC-%Change-Post: 6 %
FVC-%Pred-Post: 75 %
FVC-%Pred-Pre: 70 %
FVC-Post: 2.42 L
FVC-Pre: 2.26 L
Post FEV1/FVC ratio: 84 %
Post FEV6/FVC ratio: 100 %
Pre FEV1/FVC ratio: 83 %
Pre FEV6/FVC Ratio: 100 %
RV % pred: 290 %
RV: 5.75 L
TLC % pred: 147 %
TLC: 8.14 L

## 2021-02-18 NOTE — Progress Notes (Signed)
Full PFT performed today. °

## 2021-02-18 NOTE — Patient Instructions (Signed)
Full PFT performed today. °

## 2021-02-23 ENCOUNTER — Encounter: Payer: Self-pay | Admitting: Pulmonary Disease

## 2021-02-23 ENCOUNTER — Ambulatory Visit (INDEPENDENT_AMBULATORY_CARE_PROVIDER_SITE_OTHER): Payer: BC Managed Care – PPO | Admitting: Pulmonary Disease

## 2021-02-23 VITALS — BP 126/66 | HR 72 | Temp 99.0°F | Ht 67.0 in | Wt 203.8 lb

## 2021-02-23 DIAGNOSIS — R0600 Dyspnea, unspecified: Secondary | ICD-10-CM

## 2021-02-23 NOTE — Patient Instructions (Signed)
I am glad you are doing better with the Symbicort I have reviewed your lung function test which did not show any lung impairment Continue therapy as planned Follow-up in 6 months

## 2021-02-23 NOTE — Progress Notes (Signed)
Mikayla Wiley    [id number]    May 31, 1970  Primary Care Physician:[facility]  Referring Physician: [facility]pper [facility] @ [place],  [place] [address]  Chief complaint:   Follow-up for asthma  HPI: 51 year old with history of allergies, migraine Developed COVID-19 in December 2020 and January 2022.  Since January she has been reporting increased shortness of breath, chest congestion, occasional wheeze Recently started on Breo inhaler by her primary care.  On the third day of using when she took a deep breath and held it she had a syncopal episode and fell in the bathtub.  Evaluated in the emergency room with CTA which was normal.  Breo was held and she was instructed to follow-up with pulmonary  Chief complaint is dyspnea on exertion at rest.  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  No feather pillows or comforters Smoking history: Never smoker Travel history: No travel history Relevant family history: Grandmother had lung cancer.  She was a smoker.  Interim history: Memory Dance was changed to Symbicort 160 at last visit. She is using this every day and feels that this is better tolerated.  Notes some improvement in breathing.  Still has to use her albuterol during the day and rarely at night  Outpatient Encounter Medications as of 02/23/2021  Medication Sig   acetaminophen (TYLENOL) 325 MG tablet Take 2 tablets (650 mg total) by mouth every 6 (six) hours as needed.   acetaminophen (TYLENOL) 500 MG tablet Take 1,000 mg by mouth every 6 (six) hours as needed for mild pain.   albuterol (VENTOLIN HFA) 108 (90 Base) MCG/ACT inhaler Inhale 1-2 puffs into the lungs every 6 (six) hours as needed for wheezing or shortness of breath.   budesonide-formoterol (SYMBICORT) 160-4.5 MCG/ACT inhaler Inhale 2 puffs into the lungs in the morning and at  bedtime.   ibuprofen (ADVIL) 600 MG tablet Take 1 tablet (600 mg total) by mouth every 6 (six) hours as needed.   ibuprofen (ADVIL) 800 MG tablet Take 1 tablet (800 mg total) by mouth daily as needed for mild pain or moderate pain.   lidocaine (LIDODERM) 5 % Place 1 patch onto the skin daily as needed. Remove & Discard patch within 12 hours or as directed by MD   methocarbamol (ROBAXIN) 500 MG tablet Take 1 tablet (500 mg total) by mouth every 8 (eight) hours as needed.   oxyCODONE-acetaminophen (PERCOCET/ROXICET) 5-325 MG tablet Take 1 tablet by mouth every 6 (six) hours as needed for severe pain.   [DISCONTINUED] BREO ELLIPTA 100-25 MCG/ACT AEPB Inhale 1 puff into the lungs daily.   No facility-administered encounter medications on file as of 02/23/2021.   Physical Exam: Blood pressure 126/66, pulse 72, temperature 99 F (37.2 C), temperature source Oral, height 5\' 7"  (1.702 m), weight 203 lb 12.8 oz (92.4 kg), SpO2 100 %. 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  No acute lung abnormality.  I have reviewed the images personally.  PFTs: 02/18/2021 FVC 2.42 [75%], FEV1 2.03 [79%], F/F 84 TLC 8.14 [147%], DLCO 16.34 [70%] Overinflation, air trapping, mild diffusion defect  Labs: CBC 01/22/2021-WBC 5.1, eos 0%  Assessment:  Assessment for dyspnea Symptoms started after COVID infection I have reviewed her recent CT angiogram with no significant lung interstitial abnormalities.  She has underlying  allergies and may have reactive airway disease worsened by COVID-19  PFTs reviewed with overinflation and air trapping suggestive of small airways disease.  Though she has a mild diffusion defect on PFTs there is no evidence of interstitial lung disease on CT scan  Continue Symbicort  Plan/Recommendations: Continue Symbicort Follow-up in 6 months  Marshell Garfinkel MD  Pulmonary and Critical Care 02/23/2021, 9:40 AM  CC: [facility]

## 2021-04-26 ENCOUNTER — Ambulatory Visit (INDEPENDENT_AMBULATORY_CARE_PROVIDER_SITE_OTHER): Payer: BC Managed Care – PPO | Admitting: Surgical

## 2021-04-26 ENCOUNTER — Ambulatory Visit (INDEPENDENT_AMBULATORY_CARE_PROVIDER_SITE_OTHER): Payer: BC Managed Care – PPO

## 2021-04-26 ENCOUNTER — Encounter: Payer: Self-pay | Admitting: Surgical

## 2021-04-26 DIAGNOSIS — M25561 Pain in right knee: Secondary | ICD-10-CM

## 2021-04-26 NOTE — Progress Notes (Signed)
? ?Office Visit Note ?  ?Patient: Mikayla Wiley           ?Date of Birth: 1970-11-20           ?MRN: [id number] ?Visit Date: 04/26/2021 ?Requested by: [facility] ?[address] ?[place],  [place] [address] ?PCP: [facility] ? ?Subjective: ?Chief Complaint  ?Patient presents with  ? Right Knee - Pain  ? ? ?HPI: Mikayla Wiley is a 51 y.o. female who presents to the office complaining of right knee pain for several months.  Patient complains of primarily lateral knee pain without any history of injury.  She has history of prior surgery in the knee with right knee anterior cruciate ligament reconstruction with hamstring autograft as well as an all inside lateral meniscal repair on 02/20/2020.  She notes increased pain with extension with clicking sensation at times.  She has had no locking episodes.  She feels the knee is swollen and she has difficulty with stairs.  She feels like her knee wants to give out on her at times though it has not actually collapsed on her.  Occasionally wakes with pain.  Trying to take Tylenol and ibuprofen for pain control.  Pain has been ongoing for 2 months with no significant lasting relief..   ?             ?ROS: All systems reviewed are negative as they relate to the chief complaint within the history of present illness.  Patient denies fevers or chills. ? ?Assessment & Plan: ?Visit Diagnoses:  ?1. Right knee pain, unspecified chronicity   ? ? ?Plan: Patient is a 51 year old female who presents for evaluation of right knee pain.  She has had knee pain for several months primarily localized to the lateral aspect of the knee.  She has history of right knee ACL reconstruction with all inside lateral meniscal repair.  She did well from this initially but states that now she has had increased symptoms in the last 2 months.  ACL graft feels stable on exam today.  Does have trace effusion with joint line tenderness on exam.  Could  be delayed failure of all inside suture versus recurrent lateral meniscal tear versus progression of arthritis from time of procedure.  She did not really have any significant arthritis at the time of arthroscopy based on arthroscopic pictures and operative note.  Discussed options available to patient including trying an injection versus MRI of the right knee for further evaluation of meniscal pathology.  Discussed with her that MRI in  postsurgical knee is not as sensitive or specific but she would like to proceed regardless.  Follow-up after MRI to review results. ? ?Follow-Up Instructions: No follow-ups on file.  ? ?Orders:  ?Orders Placed This Encounter  ?Procedures  ? XR KNEE 3 VIEW RIGHT  ? MR Knee Right w/o contrast  ? ?No orders of the defined types were placed in this encounter. ? ? ? ? Procedures: ?No procedures performed ? ? ?Clinical Data: ?No additional findings. ? ?Objective: ?Vital Signs: There were no vitals taken for this visit. ? ?Physical Exam:  ?Constitutional: Patient appears well-developed ?HEENT:  ?Head: Normocephalic ?Eyes:EOM are normal ?Neck: Normal range of motion ?Cardiovascular: Normal rate ?Pulmonary/chest: Effort normal ?Neurologic: Patient is alert ?Skin: Skin is warm ?Psychiatric: Patient has normal mood and affect ? ?Ortho Exam: Ortho exam demonstrates right knee with slight hyperextension of 3 degrees to flexion of 120 degrees.  No  calf tenderness.  Negative Homans' sign.  Trace effusion noted.  Mild medial joint line tenderness.  Moderate lateral joint line tenderness.  Clicking sensation noted over the lateral joint line with passive and active range of motion of the knee joint.  ACL graft stable on Lachman exam and by anterior drawer.  Mild pain with internal rotation of the right hip joint localized to the groin.  Able to perform straight leg raise without extensor lag. ? ?Specialty Comments:  ?No specialty comments available. ? ?Imaging: ?No results found. ? ? ?PMFS  History: ?Patient Active Problem List  ? Diagnosis Date Noted  ? Rupture of anterior cruciate ligament of right knee   ? Peripheral tear of lateral meniscus of right knee as current injury   ? S/P cervical spinal fusion 01/31/2014  ? ?Past Medical History:  ?Diagnosis Date  ? COVID-19 12/2018  ? Headache   ? migrains  ?  ?Family History  ?Problem Relation Age of Onset  ? Healthy Mother   ? Healthy Father   ?  ?Past Surgical History:  ?Procedure Laterality Date  ? ANTERIOR CERVICAL DECOMP/DISCECTOMY FUSION N/A 01/31/2014  ? Procedure: C5-6 Anterior Cervical Discectomy and Fusion;  Surgeon: Eldred Manges, MD;  [facility];  Service: Orthopedics;  Laterality: N/A;  ? ANTERIOR CRUCIATE LIGAMENT REPAIR Right 02/20/2020  ? Procedure: RIGHT KNEE ANTERIOR CRUCIATE LIGAMENT (ACL) RECONSTRUCTION HAMSTRING AUTOGRAFT;  Surgeon: Cammy Copa, MD;  Location[facility];  Service: Orthopedics;  Laterality: Right;  ? CESAREAN SECTION    ? KNEE ARTHROSCOPY Right 12/91  ? ?Social History  ? ?Occupational History  ? Not on file  ?Tobacco Use  ? Smoking status: Never  ? Smokeless tobacco: Never  ?Vaping Use  ? Vaping Use: Never used  ?Substance and Sexual Activity  ? Alcohol use: No  ? Drug use: No  ? Sexual activity: Not on file  ? ? ? ? ?  ?

## 2021-05-09 ENCOUNTER — Ambulatory Visit
Admission: RE | Admit: 2021-05-09 | Discharge: 2021-05-09 | Disposition: A | Discharge status: Home / Self Care | Payer: BC Managed Care – PPO | Source: Ambulatory Visit | Attending: Surgical | Admitting: Surgical

## 2021-05-09 DIAGNOSIS — M25561 Pain in right knee: Secondary | ICD-10-CM

## 2021-05-14 ENCOUNTER — Telehealth: Payer: Self-pay

## 2021-05-14 ENCOUNTER — Encounter: Payer: Self-pay | Admitting: Orthopedic Surgery

## 2021-05-14 ENCOUNTER — Ambulatory Visit (INDEPENDENT_AMBULATORY_CARE_PROVIDER_SITE_OTHER): Payer: BC Managed Care – PPO | Admitting: Orthopedic Surgery

## 2021-05-14 DIAGNOSIS — M25561 Pain in right knee: Secondary | ICD-10-CM

## 2021-05-14 NOTE — Telephone Encounter (Signed)
Auth needed for right knee gel injection  

## 2021-05-14 NOTE — Progress Notes (Signed)
? ?Office Visit Note ?  ?Patient: Mikayla Wiley           ?Date of Birth: January 23, 1970           ?MRN: [id number] ?Visit Date: 05/14/2021 ?Requested by: [facility] ?[address] ?[place],  [place] [address] ?PCP: [facility] ? ?Subjective: ?Chief Complaint  ?Patient presents with  ? Other  ?  Scan review  ? ? ?HPI: Mikayla Wiley is a 51 year old patient here to review MRI scan of her right knee.  States that steps going up and down are still difficult.  Has had prior ACL reconstruction.  Describes primarily lateral sided pain.  Has a sleeve which helps.  Uses ibuprofen and Tylenol for symptoms.  MRI scan is reviewed.  Shows intact ACL graft and nothing really definitive complete operative in the in terms of meniscal pathology.  There is described a "tiny radial tear of the free edge of the posterior horn of the lateral meniscus.  That is difficult to see on review of the images. ?             ?ROS: All systems reviewed are negative as they relate to the chief complaint within the history of present illness.  Patient denies  fevers or chills. ? ? ?Assessment & Plan: ?Visit Diagnoses:  ?1. Right knee pain, unspecified chronicity   ? ? ?Plan: Impression is right knee stable graft with lateral sided symptoms.  Would favor injections prior to arthroscopic evaluation.  Plan to preapproved gel injection for the right knee and follow-up as needed for that injection when symptoms recur. ?This patient is diagnosed with osteoarthritis of the knee(s).   ? ?Radiographs show evidence of joint space narrowing, osteophytes, subchondral sclerosis and/or subchondral cysts.  This patient has knee pain which interferes with functional and activities of daily living.   ? ?This patient has experienced inadequate response, adverse effects and/or intolerance with conservative treatments such as acetaminophen, NSAIDS, topical creams, physical therapy or regular exercise, knee bracing  and/or weight loss.  ? ?This patient has experienced inadequate response or has a contraindication to intra articular steroid injections for at least 3 months.  ? ?This patient is not scheduled to have a total knee replacement within 6 months of starting treatment with viscosupplementation. ? ? ?Follow-Up Instructions: Return if symptoms worsen or fail to improve.  ? ?Orders:  ?No orders of the defined types were placed in this encounter. ? ?No orders of the defined types were placed in this encounter. ? ? ? ? Procedures: ?No procedures performed ? ? ?Clinical Data: ?No additional findings. ? ?Objective: ?Vital Signs: There were no vitals taken for this visit. ? ?Physical Exam:  ? ?Constitutional: Patient appears well-developed ?HEENT:  ?Head: Normocephalic ?Eyes:EOM are normal ?Neck: Normal range of motion ?Cardiovascular: Normal rate ?Pulmonary/chest: Effort normal ?Neurologic: Patient is alert ?Skin: Skin is warm ?Psychiatric: Patient has normal mood and affect ? ? ?Ortho Exam: Ortho exam demonstrates full active and passive range of motion of the right knee.  Graft is stable.  No effusion.  Mild lateral joint line tenderness with negative McMurray compression testing.  Ankle dorsiflexion intact.  Pedal pulses intact. ? ?Specialty Comments:  ?No specialty comments available. ? ?Imaging: ?No results found. ? ? ?PMFS History: ?Patient Active Problem List  ? Diagnosis Date Noted  ? Rupture of anterior cruciate ligament of right knee   ? Peripheral tear of lateral meniscus of right knee as current injury   ?  S/P cervical spinal fusion 01/31/2014  ? ?Past Medical History:  ?Diagnosis Date  ? COVID-19 12/2018  ? Headache   ? migrains  ?  ?Family History  ?Problem Relation Age of Onset  ? Healthy Mother   ? Healthy Father   ?  ?Past Surgical History:  ?Procedure Laterality Date  ? ANTERIOR CERVICAL DECOMP/DISCECTOMY FUSION N/A 01/31/2014  ? Procedure: C5-6 Anterior Cervical Discectomy and Fusion;  Surgeon: Marybelle Killings,  MD;  Location: [facility];  Service: Orthopedics;  Laterality: N/A;  ? ANTERIOR CRUCIATE LIGAMENT REPAIR Right 02/20/2020  ? Procedure: RIGHT KNEE ANTERIOR CRUCIATE LIGAMENT (ACL) RECONSTRUCTION HAMSTRING AUTOGRAFT;  Surgeon: Meredith Pel, MD;  Location: [facility];  Service: Orthopedics;  Laterality: Right;  ? CESAREAN SECTION    ? KNEE ARTHROSCOPY Right 12/91  ? ?Social History  ? ?Occupational History  ? Not on file  ?Tobacco Use  ? Smoking status: Never  ? Smokeless tobacco: Never  ?Vaping Use  ? Vaping Use: Never used  ?Substance and Sexual Activity  ? Alcohol use: No  ? Drug use: No  ? Sexual activity: Not on file  ? ? ? ? ? ?

## 2021-05-14 NOTE — Telephone Encounter (Signed)
Noted  

## 2021-09-27 ENCOUNTER — Emergency Department (HOSPITAL_BASED_OUTPATIENT_CLINIC_OR_DEPARTMENT_OTHER)
Admission: EM | Admit: 2021-09-27 | Discharge: 2021-09-27 | Disposition: A | Discharge status: Home / Self Care | Payer: BC Managed Care – PPO | Attending: Emergency Medicine | Admitting: Emergency Medicine

## 2021-09-27 ENCOUNTER — Encounter (HOSPITAL_BASED_OUTPATIENT_CLINIC_OR_DEPARTMENT_OTHER): Payer: Self-pay

## 2021-09-27 ENCOUNTER — Emergency Department (HOSPITAL_BASED_OUTPATIENT_CLINIC_OR_DEPARTMENT_OTHER): Payer: BC Managed Care – PPO | Admitting: Radiology

## 2021-09-27 ENCOUNTER — Other Ambulatory Visit: Payer: Self-pay

## 2021-09-27 DIAGNOSIS — J45909 Unspecified asthma, uncomplicated: Secondary | ICD-10-CM | POA: Insufficient documentation

## 2021-09-27 DIAGNOSIS — R059 Cough, unspecified: Secondary | ICD-10-CM | POA: Diagnosis not present

## 2021-09-27 DIAGNOSIS — J09X2 Influenza due to identified novel influenza A virus with other respiratory manifestations: Secondary | ICD-10-CM | POA: Diagnosis not present

## 2021-09-27 DIAGNOSIS — R0602 Shortness of breath: Secondary | ICD-10-CM | POA: Diagnosis not present

## 2021-09-27 DIAGNOSIS — J101 Influenza due to other identified influenza virus with other respiratory manifestations: Secondary | ICD-10-CM

## 2021-09-27 DIAGNOSIS — Z20822 Contact with and (suspected) exposure to covid-19: Secondary | ICD-10-CM | POA: Insufficient documentation

## 2021-09-27 LAB — RESP PANEL BY RT-PCR (FLU A&B, COVID) ARPGX2
Influenza A by PCR: POSITIVE — AB
Influenza B by PCR: NEGATIVE
SARS Coronavirus 2 by RT PCR: NEGATIVE

## 2021-09-27 MED ORDER — ACETAMINOPHEN 325 MG PO TABS
650.0000 mg | ORAL_TABLET | Freq: Once | ORAL | Status: AC
  Administered 2021-09-27: 650 mg via ORAL
  Filled 2021-09-27: qty 2

## 2021-09-27 NOTE — Discharge Instructions (Addendum)
You were diagnosed today with influenza.  Please take over-the-counter medications as needed for fever and pain control.  Get plenty of rest and drink plenty of fluids as you are able.  You may follow-up with your primary care provider as needed.

## 2021-09-27 NOTE — ED Provider Notes (Signed)
[facility]ERGENC[facility] DEPT Provider Note   CSN: [id number] Arrival date & time: 09/27/21  [id number]     History  Chief Complaint  Patient presents with   Generalized Body Aches    Mikayla [place] is a 51 y.o. female.  Patient presents to the hospital complaining of 2 days of body aches, cough, headache.  Patient states that her symptoms began with a cough earlier on Saturday.  During the day she began to have severe body aches and states that she has a severe headache.  The patient denies any known sick contacts.  She does complain of subjective fever.  Patient has a history of asthma and states she has been using her rescue inhaler at home over the weekend with some relief.  Past medical history otherwise noncontributory  HPI     Home Medications Prior to Admission medications   Medication Sig Start Date End Date Taking? Authorizing Provider  acetaminophen (TYLENOL) 325 MG tablet Take 2 tablets (650 mg total) by mouth every 6 (six) hours as needed. 01/22/21   Jeanell Sparrow, DO  acetaminophen (TYLENOL) 500 MG tablet Take 1,000 mg by mouth every 6 (six) hours as needed for mild pain.    [provider]  albuterol (VENTOLIN HFA) 108 (90 Base) MCG/ACT inhaler Inhale 1-2 puffs into the lungs every 6 (six) hours as needed for wheezing or shortness of breath. 02/01/20   Covington, Holli Humbles, PA-C  budesonide-formoterol (SYMBICORT) 160-4.5 MCG/ACT inhaler Inhale 2 puffs into the lungs in the morning and at bedtime. 02/05/21   Mannam, Hart Robinsons, MD  ibuprofen (ADVIL) 600 MG tablet Take 1 tablet (600 mg total) by mouth every 6 (six) hours as needed. 01/22/21   Jeanell Sparrow, DO  ibuprofen (ADVIL) 800 MG tablet Take 1 tablet (800 mg total) by mouth daily as needed for mild pain or moderate pain. 05/21/20   Magnant, Charles L, PA-C  lidocaine (LIDODERM) 5 % Place 1 patch onto the skin daily as needed. Remove & Discard patch within 12 hours or as directed by MD 01/22/21   Jeanell Sparrow,  DO  methocarbamol (ROBAXIN) 500 MG tablet Take 1 tablet (500 mg total) by mouth every 8 (eight) hours as needed. 02/20/20   Magnant, Gerrianne Scale, PA-C  oxyCODONE-acetaminophen (PERCOCET/ROXICET) 5-325 MG tablet Take 1 tablet by mouth every 6 (six) hours as needed for severe pain. 01/22/21   Jeanell Sparrow, DO      Allergies    Patient has no known allergies.    Review of Systems   Review of Systems  Constitutional:  Positive for chills and fever.  Respiratory:  Positive for cough and wheezing.   Gastrointestinal:  Negative for abdominal pain, nausea and vomiting.    Physical Exam Updated Vital Signs BP 115/84 (BP Location: Right Arm)   Pulse 73   Temp 99.6 F (37.6 C) (Oral)   Resp 18   Ht 5\' 7"  (1.702 m)   Wt 90.7 kg   LMP 02/10/2021   SpO2 96%   BMI 31.32 kg/m  Physical Exam Vitals and nursing note reviewed.  Constitutional:      General: She is not in acute distress.    Appearance: She is well-developed.  HENT:     Head: Normocephalic and atraumatic.  Eyes:     Conjunctiva/sclera: Conjunctivae normal.  Cardiovascular:     Rate and Rhythm: Normal rate and regular rhythm.     Heart sounds: No murmur heard. Pulmonary:     Effort:  Pulmonary effort is normal. No respiratory distress.     Breath sounds: Normal breath sounds.     Comments: No wheezes noted at this time Abdominal:     Palpations: Abdomen is soft.     Tenderness: There is no abdominal tenderness.  Musculoskeletal:        General: No swelling.     Cervical back: Neck supple.  Skin:    General: Skin is warm and dry.     Capillary Refill: Capillary refill takes less than 2 seconds.  Neurological:     Mental Status: She is alert.  Psychiatric:        Mood and Affect: Mood normal.     ED Results / Procedures / Treatments   Labs (all labs ordered are listed, but only abnormal results are displayed) Labs Reviewed  RESP PANEL BY RT-PCR (FLU A&B, COVID) ARPGX2 - Abnormal; Notable for the following  components:      Result Value   Influenza A by PCR POSITIVE (*)    All other components within normal limits    EKG None  Radiology DG Chest 2 View  Result Date: 09/27/2021 CLINICAL DATA:  Shortness of breath EXAM: CHEST - 2 VIEW COMPARISON:  Radiograph 01/22/2021 FINDINGS: The cardiomediastinal silhouette is within normal limits. There is no focal airspace consolidation. Haziness of the lower lungs is likely due to overlying soft tissue. There is no large pleural effusion. No evidence of pneumothorax. No acute osseous abnormality. Cervical spine fusion hardware noted. IMPRESSION: No evidence of acute cardiopulmonary disease. Electronically Signed   By: Maurine Simmering M.D.   On: 09/27/2021 10:31    Procedures Procedures    Medications Ordered in ED Medications  acetaminophen (TYLENOL) tablet 650 mg (650 mg Oral Given 09/27/21 1024)    ED Course/ Medical Decision Making/ A&P                           Medical Decision Making Amount and/or Complexity of Data Reviewed Radiology: ordered.  Risk OTC drugs.   Patient presents to the hospital with a chief complaint of body ache, chills, cough, and headache.  Differential diagnosis includes but is not limited to COVID-19, influenza, other viral illness, meningitis, and others  I reviewed the patient's past medical history and found no relevant documents or information  I ordered lab work including a respiratory panel.  Pertinent results include positive influenza A result.  I ordered the patient a Tylenol for her headache.  Upon reassessment her headache was feeling somewhat better.  I ordered and interpreted imaging personally including a chest x-ray.  No active cardiopulmonary disease noted.  The patient tested positive for influenza.  She is outside of the window for Tamiflu at this time.  No photophobia, neck stiffness to make me consider meningitis at this time.  Negative for COVID-19.  Patient will discharge home.  I instructed  the patient on supportive care including hydration, rest, and over-the-counter medications as needed.        Final Clinical Impression(s) / ED Diagnoses Final diagnoses:  Influenza A    Rx / DC Orders ED Discharge Orders     None         Ronny Bacon 09/27/21 1102    Rancour, Annie Main, MD 09/27/21 [phone]

## 2021-09-27 NOTE — ED Triage Notes (Signed)
Pt states that since the weekend, she has had body aches and fever/chills. No exposure to anyone with COVID / flu that she is aware of. No meds this morning, was taking robitussin and other OTC meds last night with some relief.

## 2021-09-29 DIAGNOSIS — J101 Influenza due to other identified influenza virus with other respiratory manifestations: Secondary | ICD-10-CM | POA: Diagnosis not present

## 2021-10-26 ENCOUNTER — Other Ambulatory Visit: Payer: Self-pay

## 2021-10-26 DIAGNOSIS — R058 Other specified cough: Secondary | ICD-10-CM | POA: Diagnosis not present

## 2021-10-26 MED ORDER — IBUPROFEN 600 MG PO TABS: 600.0000 mg | ORAL_TABLET | Freq: Two times a day (BID) | ORAL | 0 refills | Status: DC | PRN

## 2021-10-26 NOTE — Addendum Note (Signed)
Addended byLaurann Montana on: 10/26/2021 04:37 PM   Modules accepted: Orders

## 2021-10-26 NOTE — Telephone Encounter (Signed)
Ok for bid # [phone]

## 2021-10-26 NOTE — Telephone Encounter (Signed)
Ok for this? 

## 2021-11-03 DIAGNOSIS — Z1231 Encounter for screening mammogram for malignant neoplasm of breast: Secondary | ICD-10-CM | POA: Diagnosis not present

## 2021-11-16 DIAGNOSIS — M2042 Other hammer toe(s) (acquired), left foot: Secondary | ICD-10-CM | POA: Diagnosis not present

## 2021-11-16 DIAGNOSIS — M216X1 Other acquired deformities of right foot: Secondary | ICD-10-CM | POA: Diagnosis not present

## 2021-11-16 DIAGNOSIS — M216X2 Other acquired deformities of left foot: Secondary | ICD-10-CM | POA: Diagnosis not present

## 2021-11-16 DIAGNOSIS — M2041 Other hammer toe(s) (acquired), right foot: Secondary | ICD-10-CM | POA: Diagnosis not present

## 2021-11-22 DIAGNOSIS — R058 Other specified cough: Secondary | ICD-10-CM | POA: Diagnosis not present

## 2021-12-08 ENCOUNTER — Encounter (HOSPITAL_BASED_OUTPATIENT_CLINIC_OR_DEPARTMENT_OTHER): Payer: Self-pay | Admitting: Emergency Medicine

## 2021-12-08 ENCOUNTER — Other Ambulatory Visit: Payer: Self-pay

## 2021-12-08 DIAGNOSIS — M722 Plantar fascial fibromatosis: Secondary | ICD-10-CM | POA: Insufficient documentation

## 2021-12-08 DIAGNOSIS — M79671 Pain in right foot: Secondary | ICD-10-CM | POA: Diagnosis not present

## 2021-12-08 NOTE — ED Triage Notes (Signed)
Pt c/o continued right foot pain from plantar fasciitis. Pt has been seen by ortho for the same

## 2021-12-09 ENCOUNTER — Emergency Department (HOSPITAL_BASED_OUTPATIENT_CLINIC_OR_DEPARTMENT_OTHER)
Admission: EM | Admit: 2021-12-09 | Discharge: 2021-12-09 | Disposition: A | Discharge status: Home / Self Care | Payer: BC Managed Care – PPO | Attending: Emergency Medicine | Admitting: Emergency Medicine

## 2021-12-09 DIAGNOSIS — M722 Plantar fascial fibromatosis: Secondary | ICD-10-CM

## 2021-12-09 DIAGNOSIS — M79671 Pain in right foot: Secondary | ICD-10-CM | POA: Diagnosis not present

## 2021-12-09 DIAGNOSIS — M205X2 Other deformities of toe(s) (acquired), left foot: Secondary | ICD-10-CM | POA: Diagnosis not present

## 2021-12-09 DIAGNOSIS — M7671 Peroneal tendinitis, right leg: Secondary | ICD-10-CM | POA: Diagnosis not present

## 2021-12-09 DIAGNOSIS — S86111A Strain of other muscle(s) and tendon(s) of posterior muscle group at lower leg level, right leg, initial encounter: Secondary | ICD-10-CM | POA: Diagnosis not present

## 2021-12-09 MED ORDER — ACETAMINOPHEN 325 MG PO TABS
650.0000 mg | ORAL_TABLET | Freq: Once | ORAL | Status: AC
  Administered 2021-12-09: 650 mg via ORAL
  Filled 2021-12-09: qty 2

## 2021-12-09 MED ORDER — NAPROXEN 500 MG PO TABS: 500.0000 mg | ORAL_TABLET | Freq: Two times a day (BID) | ORAL | 0 refills | Status: DC

## 2021-12-09 MED ORDER — NAPROXEN 250 MG PO TABS
500.0000 mg | ORAL_TABLET | Freq: Once | ORAL | Status: AC
Start: 2021-12-09 — End: 2021-12-09
  Administered 2021-12-09: 500 mg via ORAL
  Filled 2021-12-09: qty 2

## 2021-12-09 NOTE — Discharge Instructions (Signed)
Apply ice for 30 minutes at a time, 4 times a day.  Use crutches as needed until you are able to bear weight comfortably.  In addition to the naproxen, you may take acetaminophen as needed for pain.  Please follow-up with your foot doctor.

## 2021-12-09 NOTE — ED Provider Notes (Signed)
[facility] DEPT Provider Note   CSN: [id number] Arrival date & time: 12/08/21  2128     History  Chief Complaint  Patient presents with   Foot Pain    Mikayla [place] is a 51 y.o. female.  The history is provided by the patient.  Foot Pain  She has history of plantar fasciitis in both feet and comes in with pain in the right foot which started yesterday.  This pain is typical of what she has with a flareup of plantar fasciitis.  She had recently seen a podiatrist who had prescribed some orthotics which had initially been helping her.  She has been taking over-the-counter acetaminophen with slight relief of pain.   Home Medications Prior to Admission medications   Medication Sig Start Date End Date Taking? Authorizing Provider  acetaminophen (TYLENOL) 325 MG tablet Take 2 tablets (650 mg total) by mouth every 6 (six) hours as needed. 01/22/21   Jeanell Sparrow, DO  acetaminophen (TYLENOL) 500 MG tablet Take 1,000 mg by mouth every 6 (six) hours as needed for mild pain.    [provider]  albuterol (VENTOLIN HFA) 108 (90 Base) MCG/ACT inhaler Inhale 1-2 puffs into the lungs every 6 (six) hours as needed for wheezing or shortness of breath. 02/01/20   Covington, Holli Humbles, PA-C  budesonide-formoterol (SYMBICORT) 160-4.5 MCG/ACT inhaler Inhale 2 puffs into the lungs in the morning and at bedtime. 02/05/21   Mannam, Hart Robinsons, MD  ibuprofen (ADVIL) 600 MG tablet Take 1 tablet (600 mg total) by mouth 2 (two) times daily as needed. 10/26/21   Meredith Pel, MD  ibuprofen (ADVIL) 800 MG tablet Take 1 tablet (800 mg total) by mouth daily as needed for mild pain or moderate pain. 05/21/20   Magnant, Charles L, PA-C  lidocaine (LIDODERM) 5 % Place 1 patch onto the skin daily as needed. Remove & Discard patch within 12 hours or as directed by MD 01/22/21   Jeanell Sparrow, DO  methocarbamol (ROBAXIN) 500 MG tablet Take 1 tablet (500 mg total) by mouth every 8 (eight)  hours as needed. 02/20/20   Magnant, Gerrianne Scale, PA-C  oxyCODONE-acetaminophen (PERCOCET/ROXICET) 5-325 MG tablet Take 1 tablet by mouth every 6 (six) hours as needed for severe pain. 01/22/21   Jeanell Sparrow, DO      Allergies    Patient has no known allergies.    Review of Systems   Review of Systems  All other systems reviewed and are negative.   Physical Exam Updated Vital Signs BP (!) 145/78 (BP Location: Right Arm)   Pulse 62   Temp 97.6 F (36.4 C)   Resp 16   Ht 5\' 7"  (1.702 m)   Wt 88.5 kg   LMP 02/10/2021   SpO2 100%   BMI 30.54 kg/m  Physical Exam Vitals and nursing note reviewed.   51 year old female, resting comfortably and in no acute distress. Vital signs are significant for borderline elevated blood pressure. Oxygen saturation is 100%, which is normal. Head is normocephalic and atraumatic. PERRLA, EOMI. Oropharynx is clear. Neck is nontender and supple without adenopathy or JVD. Back is nontender and there is no CVA tenderness. Lungs are clear without rales, wheezes, or rhonchi. Chest is nontender. Heart has regular rate and rhythm without murmur. Abdomen is soft, flat, nontender. Extremities: There is no swelling or deformity of the right foot, but there is tenderness to palpation rather diffusely over the plantar surface of the right foot.  Pain is elicited when tension is applied to the plantar fascia. Skin is warm and dry without rash. Neurologic: Mental status is normal, cranial nerves are intact, moves all extremities equally.  ED Results / Procedures / Treatments    Procedures Procedures    Medications Ordered in ED Medications  naproxen (NAPROSYN) tablet 500 mg (has no administration in time range)  acetaminophen (TYLENOL) tablet 650 mg (has no administration in time range)    ED Course/ Medical Decision Making/ A&P                           Medical Decision Making  Exacerbation of plantar fasciitis of the right foot.  She is not able to  bear weight comfortably, I have ordered crutches for her.  I have ordered a dose of naproxen and acetaminophen.  I am discharging her with a prescription for naproxen.  I have advised her to apply ice, use over-the-counter acetaminophen in addition to the naproxen.  I have recommended she follow-up with her podiatrist.  I have reviewed her past records, and she had an office visit with her podiatrist on 11/16/2021 with diagnoses of pronation deformity of both feet, hammertoe deformity of both feet.  Final Clinical Impression(s) / ED Diagnoses Final diagnoses:  Plantar fasciitis of right foot    Rx / DC Orders ED Discharge Orders          Ordered    naproxen (NAPROSYN) 500 MG tablet  2 times daily        12/09/21 0432              Dione Booze, MD 12/09/21 [phone]

## 2021-12-10 DIAGNOSIS — Z124 Encounter for screening for malignant neoplasm of cervix: Secondary | ICD-10-CM | POA: Diagnosis not present

## 2021-12-10 DIAGNOSIS — Z01419 Encounter for gynecological examination (general) (routine) without abnormal findings: Secondary | ICD-10-CM | POA: Diagnosis not present

## 2021-12-30 DIAGNOSIS — M7671 Peroneal tendinitis, right leg: Secondary | ICD-10-CM | POA: Diagnosis not present

## 2021-12-30 DIAGNOSIS — M7661 Achilles tendinitis, right leg: Secondary | ICD-10-CM | POA: Diagnosis not present

## 2022-02-28 DIAGNOSIS — M7062 Trochanteric bursitis, left hip: Secondary | ICD-10-CM | POA: Diagnosis not present

## 2022-02-28 DIAGNOSIS — M5432 Sciatica, left side: Secondary | ICD-10-CM | POA: Diagnosis not present

## 2022-03-02 ENCOUNTER — Encounter: Payer: Self-pay | Admitting: Physician Assistant

## 2022-03-02 ENCOUNTER — Ambulatory Visit (INDEPENDENT_AMBULATORY_CARE_PROVIDER_SITE_OTHER): Payer: BC Managed Care – PPO | Admitting: Physician Assistant

## 2022-03-02 ENCOUNTER — Ambulatory Visit (INDEPENDENT_AMBULATORY_CARE_PROVIDER_SITE_OTHER): Payer: BC Managed Care – PPO

## 2022-03-02 DIAGNOSIS — G8929 Other chronic pain: Secondary | ICD-10-CM

## 2022-03-02 DIAGNOSIS — M5442 Lumbago with sciatica, left side: Secondary | ICD-10-CM

## 2022-03-02 NOTE — Progress Notes (Signed)
Office Visit Note   Patient: Mikayla [place]           Date of Birth: 1970-02-05           MRN: [id number] Visit Date: 03/02/2022              Requested by: [facility] @ [place],  [place] [address] PCP: [facility] @ [place]  Chief Complaint  Patient presents with  . Left Hip - Pain      HPI: Mikayla Wiley is a pleasant 52 year old woman with a chief complaint of left hip pain.  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  Denies any other injuries.  She does have a history of sciatica after she had her children.  She is not sure if this is a sciatica or something else.  She focuses her pain on the lateral side of the left hip.  She does occasionally have radiating symptoms down the back of her leg into her foot.  She recently saw her primary care this week who gave her some stretching exercises as well as began her on meloxicam  Assessment & Plan: Visit Diagnoses:  1. Chronic left-sided low back pain with left-sided sciatica   Trochanteric bursitis  Plan: I think her biggest complaint today is consistent with left trochanteric bursitis.  We talked about the natural history of this.  Certainly I think the anti-inflammatory and stretching exercises would be helpful to her.  The anti-inflammatory could also help with some of her sciatic symptoms.  She is neurovascular intact today.  I offered her an injection into the trochanteric bursa but she would first like to see if more conservative measures will help with better.  She may follow-up at any time for this injection  Follow-Up Instructions: Return if symptoms worsen or fail to improve.   Ortho Exam  Patient is alert, oriented, no adenopathy, well-dressed, normal affect, normal respiratory effort. Left hip she is neurovascularly intact.  She has good strength with dorsiflexion plantarflexion  eversion inversion of her ankle.  No tenderness with manipulation of her hip.  Her sensation is grossly intact today.  She does have focal tenderness over the trochanteric bursa.  Her compartments are soft and nontender  Imaging: XR Lumbar Spine 2-3 Views  Result Date: 03/02/2022 2 view x-rays of her lumbar spine demonstrate overall well-maintained joint spacing a little joint sclerosis and narrowing at L5-S1 no acute fractures  No images are attached to the encounter.  Labs: No results found for: "HGBA1C", "ESRSEDRATE", "CRP", "LABURIC", "REPTSTATUS", "GRAMSTAIN", "CULT", "LABORGA"   Lab Results  Component Value Date   ALBUMIN 4.3 01/22/2021   ALBUMIN 3.8 01/03/2019   ALBUMIN 3.6 02/01/2014    No results found for: "MG" No results found for: "VD25OH"  No results found for: "PREALBUMIN"    Latest Ref Rng & Units 01/22/2021   10:00 AM 02/17/2020    8:28 AM 01/03/2019    6:53 PM  CBC EXTENDED  WBC 4.0 - 10.5 K/uL 5.1  5.1  2.7   RBC 3.87 - 5.11 MIL/uL 4.36  4.31  4.48   Hemoglobin 12.0 - 15.0 g/dL 13.0  12.9  13.7   HCT 36.0 - 46.0 % 39.7  40.1  41.6   Platelets 150 - 400 K/uL 243  225  180   NEUT# 1.7 - 7.7 K/uL 3.2  Lymph# 0.7 - 4.0 K/uL 1.6        There is no height or weight on file to calculate BMI.  Orders:  Orders Placed This Encounter  Procedures  . XR Lumbar Spine 2-3 Views   No orders of the defined types were placed in this encounter.    Procedures: No procedures performed  Clinical Data: No additional findings.  ROS:  All other systems negative, except as noted in the HPI. Review of Systems  All other systems reviewed and are negative.  Objective: Vital Signs: LMP 02/10/2021   Specialty Comments:  No specialty comments available.  PMFS History: Patient Active Problem List   Diagnosis Date Noted  . Rupture of anterior cruciate ligament of right knee   . Peripheral tear of lateral meniscus of right knee as current injury   . S/P  cervical spinal fusion 01/31/2014   Past Medical History:  Diagnosis Date  . COVID-19 12/2018  . Headache    migrains    Family History  Problem Relation Age of Onset  . Healthy Mother   . Healthy Father     Past Surgical History:  Procedure Laterality Date  . ANTERIOR CERVICAL DECOMP/DISCECTOMY FUSION N/A 01/31/2014   Procedure: C5-6 Anterior Cervical Discectomy and Fusion;  Surgeon: Marybelle Killings, MD;  Location: [facility];  Service: Orthopedics;  Laterality: N/A;  . ANTERIOR CRUCIATE LIGAMENT REPAIR Right 02/20/2020   Procedure: RIGHT KNEE ANTERIOR CRUCIATE LIGAMENT (ACL) RECONSTRUCTION HAMSTRING AUTOGRAFT;  Surgeon: Meredith Pel, MD;  Location: [facility];  Service: Orthopedics;  Laterality: Right;  . CESAREAN SECTION    . KNEE ARTHROSCOPY Right 12/91   Social History   Occupational History  . Not on file  Tobacco Use  . Smoking status: Never  . Smokeless tobacco: Never  Vaping Use  . Vaping Use: Never used  Substance and Sexual Activity  . Alcohol use: No  . Drug use: No  . Sexual activity: Not on file

## 2022-06-22 ENCOUNTER — Ambulatory Visit: Payer: 59 | Admitting: Physician Assistant

## 2022-12-15 ENCOUNTER — Other Ambulatory Visit: Payer: Self-pay

## 2022-12-15 ENCOUNTER — Encounter: Payer: Self-pay | Admitting: Emergency Medicine

## 2022-12-15 ENCOUNTER — Emergency Department
Admission: EM | Admit: 2022-12-15 | Discharge: 2022-12-16 | Disposition: A | Discharge status: Home / Self Care | Payer: 59 | Attending: Emergency Medicine | Admitting: Emergency Medicine

## 2022-12-15 DIAGNOSIS — L03111 Cellulitis of right axilla: Secondary | ICD-10-CM | POA: Diagnosis present

## 2022-12-15 DIAGNOSIS — Z8616 Personal history of COVID-19: Secondary | ICD-10-CM | POA: Diagnosis not present

## 2022-12-15 MED ORDER — DOXYCYCLINE HYCLATE 100 MG PO TABS
100.0000 mg | ORAL_TABLET | Freq: Once | ORAL | Status: AC
  Administered 2022-12-15: 100 mg via ORAL
  Filled 2022-12-15: qty 1

## 2022-12-15 MED ORDER — CEPHALEXIN 500 MG PO CAPS
500.0000 mg | ORAL_CAPSULE | Freq: Once | ORAL | Status: AC
  Administered 2022-12-15: 500 mg via ORAL
  Filled 2022-12-15: qty 1

## 2022-12-15 MED ORDER — CEPHALEXIN 500 MG PO CAPS: 500.0000 mg | ORAL_CAPSULE | Freq: Three times a day (TID) | ORAL | 0 refills | Status: DC

## 2022-12-15 MED ORDER — HYDROCODONE-ACETAMINOPHEN 5-325 MG PO TABS: 1.0000 | ORAL_TABLET | Freq: Four times a day (QID) | ORAL | 0 refills | Status: DC | PRN

## 2022-12-15 MED ORDER — DOXYCYCLINE HYCLATE 50 MG PO CAPS: 100.0000 mg | ORAL_CAPSULE | Freq: Two times a day (BID) | ORAL | 0 refills | Status: DC

## 2022-12-15 MED ORDER — KETOROLAC TROMETHAMINE 60 MG/2ML IM SOLN
30.0000 mg | Freq: Once | INTRAMUSCULAR | Status: AC
  Administered 2022-12-15: 30 mg via INTRAMUSCULAR
  Filled 2022-12-15: qty 2

## 2022-12-15 NOTE — Discharge Instructions (Signed)
Take and finish antibiotics as prescribed.  You may take ibuprofen as needed for pain; Norco as needed for more severe pain.  Recommend moist heat several times daily.  Return to the ER for worsening symptoms, persistent vomiting, fever or other concerns.

## 2022-12-15 NOTE — ED Triage Notes (Signed)
Patient ambulatory to triage with complaints of possible insect bite/ rash from shaving?. Patient complaints of spot on the right armpit that is sensitive to touch. This RN could only visualize 1/2 that she states she has. She states they are very sensitive to touch. Denies drainage/fevers. States she first noticed on Tuesday but they have become significantly more painful.

## 2022-12-15 NOTE — ED Provider Notes (Signed)
[facility] Provider Note    Event Date/Time   First MD Initiated Contact with Patient 12/15/22 2323     (approximate)   History   Insect Bite   HPI  Mikayla [place] is a 52 y.o. female who presents to the ED from home with a chief complaint of painful right axilla x 2 days.  Patient states she shaves regularly and has never had an abscess before.  Thinks an insect bit her because she is having an itchy painful spot on her back as well.  Reports diffuse axilla pain which is sensitive to touch.  Denies fever/chills, chest pain, shortness of breath, nausea/vomiting or dizziness.     Past Medical History   Past Medical History:  Diagnosis Date   COVID-19 12/2018   Headache    migrains     Active Problem List   Patient Active Problem List   Diagnosis Date Noted   Rupture of anterior cruciate ligament of right knee    Peripheral tear of lateral meniscus of right knee as current injury    S/P cervical spinal fusion 01/31/2014     Past Surgical History   Past Surgical History:  Procedure Laterality Date   ANTERIOR CERVICAL DECOMP/DISCECTOMY FUSION N/A 01/31/2014   Procedure: C5-6 Anterior Cervical Discectomy and Fusion;  Surgeon: Eldred Manges, MD;  Location: [facility];  Service: Orthopedics;  Laterality: N/A;   ANTERIOR CRUCIATE LIGAMENT REPAIR Right 02/20/2020   Procedure: RIGHT KNEE ANTERIOR CRUCIATE LIGAMENT (ACL) RECONSTRUCTION HAMSTRING AUTOGRAFT;  Surgeon: Cammy Copa, MD;  Location: [facility];  Service: Orthopedics;  Laterality: Right;   CESAREAN SECTION     KNEE ARTHROSCOPY Right 12/91     Home Medications   Prior to Admission medications   Medication Sig Start Date End Date Taking? Authorizing Provider  cephALEXin (KEFLEX) 500 MG capsule Take 1 capsule (500 mg total) by mouth 3 (three) times daily. 12/15/22  Yes Irean Hong, MD  doxycycline (VIBRAMYCIN) 50 MG capsule Take 2 capsules (100 mg total) by mouth 2 (two) times daily.  12/15/22  Yes Irean Hong, MD  HYDROcodone-acetaminophen (NORCO) 5-325 MG tablet Take 1 tablet by mouth every 6 (six) hours as needed for moderate pain (pain score 4-6). 12/15/22  Yes Irean Hong, MD  acetaminophen (TYLENOL) 325 MG tablet Take 2 tablets (650 mg total) by mouth every 6 (six) hours as needed. 01/22/21   Sloan Leiter, DO  acetaminophen (TYLENOL) 500 MG tablet Take 1,000 mg by mouth every 6 (six) hours as needed for mild pain.    [provider]  albuterol (VENTOLIN HFA) 108 (90 Base) MCG/ACT inhaler Inhale 1-2 puffs into the lungs every 6 (six) hours as needed for wheezing or shortness of breath. 02/01/20   Covington, Brand Males, PA-C  budesonide-formoterol (SYMBICORT) 160-4.5 MCG/ACT inhaler Inhale 2 puffs into the lungs in the morning and at bedtime. 02/05/21   Mannam, Colbert Coyer, MD  ibuprofen (ADVIL) 600 MG tablet Take 1 tablet (600 mg total) by mouth 2 (two) times daily as needed. 10/26/21   Cammy Copa, MD  ibuprofen (ADVIL) 800 MG tablet Take 1 tablet (800 mg total) by mouth daily as needed for mild pain or moderate pain. 05/21/20   Magnant, Charles L, PA-C  lidocaine (LIDODERM) 5 % Place 1 patch onto the skin daily as needed. Remove & Discard patch within 12 hours or as directed by MD 01/22/21   Tanda Rockers A, DO  naproxen (NAPROSYN) 500 MG  tablet Take 1 tablet (500 mg total) by mouth 2 (two) times daily. 12/09/21   Dione Booze, MD     Allergies  Patient has no known allergies.   Family History   Family History  Problem Relation Age of Onset   Healthy Mother    Healthy Father      Physical Exam  Triage Vital Signs: ED Triage Vitals [12/15/22 2015]  Encounter Vitals Group     BP 118/76     Systolic BP Percentile      Diastolic BP Percentile      Pulse Rate 69     Resp 18     Temp 98.4 F (36.9 C)     Temp Source Oral     SpO2 100 %     Weight 202 lb (91.6 kg)     Height 5\' 7"  (1.702 m)     Head Circumference      Peak Flow      Pain Score 8      Pain Loc      Pain Education      Exclude from Growth Chart     Updated Vital Signs: BP 118/76   Pulse 69   Temp 98.4 F (36.9 C) (Oral)   Resp 18   Ht 5\' 7"  (1.702 m)   Wt 91.6 kg   LMP 02/10/2021   SpO2 100%   BMI 31.64 kg/m    General: Awake, no distress.  CV:  Good peripheral perfusion.  Resp:  Normal effort.  Abd:  No distention.  Other:  Right axilla: Diffuse warmth, no erythema.  Tender to palpation.  No fluctuance, no induration.  Upper thoracic midline spine with red wheal consistent with insect bite.   ED Results / Procedures / Treatments  Labs (all labs ordered are listed, but only abnormal results are displayed) Labs Reviewed - No data to display   EKG  None   RADIOLOGY None   Official radiology report(s): No results found.   PROCEDURES:  Critical Care performed: No  Procedures   MEDICATIONS ORDERED IN ED: Medications  ketorolac (TORADOL) injection 30 mg (has no administration in time range)  cephALEXin (KEFLEX) capsule 500 mg (has no administration in time range)  doxycycline (VIBRA-TABS) tablet 100 mg (has no administration in time range)     IMPRESSION / MDM / ASSESSMENT AND PLAN / ED COURSE  I reviewed the triage vital signs and the nursing notes.                             52 year old female presenting with painful right axilla.  Clinical exam consistent with cellulitis.  Will double cover with Keflex + Doxycycline, administer analgesia and patient will follow-up with her PCP.  Recommend moist heat several times daily.  Strict return precautions given.  Patient verbalizes understanding and agrees with plan of care.  Patient's presentation is most consistent with acute, uncomplicated illness.   FINAL CLINICAL IMPRESSION(S) / ED DIAGNOSES   Final diagnoses:  Cellulitis of right axilla     Rx / DC Orders   ED Discharge Orders          Ordered    cephALEXin (KEFLEX) 500 MG capsule  3 times daily        12/15/22 2335     doxycycline (VIBRAMYCIN) 50 MG capsule  2 times daily        12/15/22 2335    HYDROcodone-acetaminophen (NORCO) 5-325 MG tablet  Every 6 hours PRN        12/15/22 2335             Note:  This document was prepared using Dragon voice recognition software and may include unintentional dictation errors.   Irean Hong, MD 12/16/22 [phone]

## 2022-12-18 ENCOUNTER — Encounter (HOSPITAL_COMMUNITY): Payer: Self-pay | Admitting: Emergency Medicine

## 2022-12-18 ENCOUNTER — Other Ambulatory Visit: Payer: Self-pay

## 2022-12-18 ENCOUNTER — Emergency Department (HOSPITAL_COMMUNITY)
Admission: EM | Admit: 2022-12-18 | Discharge: 2022-12-18 | Disposition: A | Discharge status: Home / Self Care | Payer: 59 | Attending: Emergency Medicine | Admitting: Emergency Medicine

## 2022-12-18 DIAGNOSIS — B029 Zoster without complications: Secondary | ICD-10-CM | POA: Insufficient documentation

## 2022-12-18 DIAGNOSIS — R21 Rash and other nonspecific skin eruption: Secondary | ICD-10-CM | POA: Diagnosis present

## 2022-12-18 MED ORDER — OXYCODONE-ACETAMINOPHEN 5-325 MG PO TABS: 1.0000 | ORAL_TABLET | Freq: Three times a day (TID) | ORAL | 0 refills | Status: AC | PRN

## 2022-12-18 MED ORDER — ACYCLOVIR 200 MG PO CAPS
800.0000 mg | ORAL_CAPSULE | Freq: Once | ORAL | Status: AC
  Administered 2022-12-18: 800 mg via ORAL
  Filled 2022-12-18: qty 4

## 2022-12-18 MED ORDER — ACYCLOVIR 400 MG PO TABS: 800.0000 mg | ORAL_TABLET | Freq: Every day | ORAL | 0 refills | Status: AC

## 2022-12-18 NOTE — Discharge Instructions (Addendum)
It was a pleasure caring for you today. Please follow up with your primary care provider in the next couple of days. Seek emergency care if experiencing any new or worsening symptoms.  Alternating between 650 mg Tylenol and 400 mg Advil: The best way to alternate taking Acetaminophen (example Tylenol) and Ibuprofen (example Advil/Motrin) is to take them 3 hours apart. For example, if you take ibuprofen at 6 am you can then take Tylenol at 9 am. You can continue this regimen throughout the day, making sure you do not exceed the recommended maximum dose for each drug.

## 2022-12-18 NOTE — ED Triage Notes (Signed)
Patient BIBA from home c/o of insect bite on her posterior neck on the R side. Was seen at [facility] and put on doxycycline, hydrocodone, and cefelexin. Since being on the antibiotic has only gotten worse with edema in the R arm and increased redness. VSS

## 2022-12-18 NOTE — ED Provider Notes (Signed)
[place] EMERGENCY DEPARTMENT AT [facility] Provider Note   CSN: [id number] Arrival date & time: 12/18/22  6295     History  Chief Complaint  Patient presents with   Insect Bite   Wound Infection    Mikayla Wiley is a 52 y.o. female with PMHx headaches who presents to ED concerned for painful rash of axilla. Patient stating that this area started itching at least 1 week ago but did not become erythematous until 3 days ago. Patient was seen in ED 3 days ago, prescribed Doxy and Keflex, and discharged with a diagnosis of cellulitis. Patient stating that the pain is not getting better on the ABX. Patient endorses chickenpox as a child. Denies shingles vaccines.  Denies fever, chest pain, dyspnea, cough, nausea, vomiting, diarrhea. Patient denies HIV, IVDU, cancer, and any other reason for immunocompromise.  HPI     Home Medications Prior to Admission medications   Medication Sig Start Date End Date Taking? Authorizing Provider  acyclovir (ZOVIRAX) 400 MG tablet Take 2 tablets (800 mg total) by mouth 5 (five) times daily for 7 days. 12/18/22 12/25/22 Yes Valrie Hart F, PA-C  oxyCODONE-acetaminophen (PERCOCET/ROXICET) 5-325 MG tablet Take 1 tablet by mouth every 8 (eight) hours as needed for up to 2 days for severe pain (pain score 7-10). 12/18/22 12/20/22 Yes Valrie Hart F, PA-C  acetaminophen (TYLENOL) 325 MG tablet Take 2 tablets (650 mg total) by mouth every 6 (six) hours as needed. 01/22/21   Sloan Leiter, DO  acetaminophen (TYLENOL) 500 MG tablet Take 1,000 mg by mouth every 6 (six) hours as needed for mild pain.    [provider]  albuterol (VENTOLIN HFA) 108 (90 Base) MCG/ACT inhaler Inhale 1-2 puffs into the lungs every 6 (six) hours as needed for wheezing or shortness of breath. 02/01/20   Covington, Brand Males, PA-C  budesonide-formoterol (SYMBICORT) 160-4.5 MCG/ACT inhaler Inhale 2 puffs into the lungs in the morning and at bedtime. 02/05/21    Mannam, Colbert Coyer, MD  cephALEXin (KEFLEX) 500 MG capsule Take 1 capsule (500 mg total) by mouth 3 (three) times daily. 12/15/22   Irean Hong, MD  doxycycline (VIBRAMYCIN) 50 MG capsule Take 2 capsules (100 mg total) by mouth 2 (two) times daily. 12/15/22   Irean Hong, MD  HYDROcodone-acetaminophen (NORCO) 5-325 MG tablet Take 1 tablet by mouth every 6 (six) hours as needed for moderate pain (pain score 4-6). 12/15/22   Irean Hong, MD  ibuprofen (ADVIL) 600 MG tablet Take 1 tablet (600 mg total) by mouth 2 (two) times daily as needed. 10/26/21   Cammy Copa, MD  ibuprofen (ADVIL) 800 MG tablet Take 1 tablet (800 mg total) by mouth daily as needed for mild pain or moderate pain. 05/21/20   Magnant, Charles L, PA-C  lidocaine (LIDODERM) 5 % Place 1 patch onto the skin daily as needed. Remove & Discard patch within 12 hours or as directed by MD 01/22/21   Tanda Rockers A, DO  naproxen (NAPROSYN) 500 MG tablet Take 1 tablet (500 mg total) by mouth 2 (two) times daily. 12/09/21   Dione Booze, MD      Allergies    Patient has no known allergies.    Review of Systems   Review of Systems  Skin:  Positive for rash.    Physical Exam Updated Vital Signs BP 133/70 (BP Location: Left Arm)   Pulse (!) 59   Temp 97.8 F (36.6 C) (Oral)   Resp  18   Ht 5\' 7"  (1.702 m)   Wt 91.6 kg   LMP 02/10/2021   SpO2 100%   BMI 31.64 kg/m  Physical Exam Vitals and nursing note reviewed.  Constitutional:      General: She is not in acute distress.    Appearance: She is not ill-appearing or toxic-appearing.  HENT:     Head: Normocephalic and atraumatic.  Eyes:     General: No scleral icterus.       Right eye: No discharge.        Left eye: No discharge.     Conjunctiva/sclera: Conjunctivae normal.  Cardiovascular:     Rate and Rhythm: Normal rate.  Pulmonary:     Effort: Pulmonary effort is normal.  Abdominal:     General: Abdomen is flat.  Skin:    General: Skin is warm and dry.      Comments: Vesicular rash eruption along T2/3 dermatome in patient's right axilla and scattered eruptions on her back. Negative nikolsky sign.  Neurological:     General: No focal deficit present.     Mental Status: She is alert. Mental status is at baseline.  Psychiatric:        Mood and Affect: Mood normal.        Behavior: Behavior normal.     ED Results / Procedures / Treatments   Labs (all labs ordered are listed, but only abnormal results are displayed) Labs Reviewed - No data to display  EKG None  Radiology No results found.  Procedures Procedures    Medications Ordered in ED Medications  acyclovir (ZOVIRAX) 200 MG capsule 800 mg (has no administration in time range)    ED Course/ Medical Decision Making/ A&P                                 Medical Decision Making  This patient presents to the ED for concern of rash, this involves an extensive number of treatment options, and is a complaint that carries with it a high risk of complications and morbidity.  The differential diagnosis includes irritant contact dermatitis, DRESS, atopic dermatitis, anaphylaxis, SJS/TEN   Co morbidities that complicate the patient evaluation  none   Additional history obtained:  PCP with [facility] 12/15/2022 ED note: workup was reassuring and patient discharged with Keflex and Doxy   Problem List / ED Course / Critical interventions / Medication management  Patient presents to ED concerned for rash on right axilla spreading along T2 dermatome to right side of back. Mild erythema just surrounding scattered vesicular lesions - concerning for shingles. Patient endorses hx of chickenpox and denies shingles vaccine. Patient denying any infectious symptoms today. Patient afebrile with stable vitals. Provided patient with a  dose of acyclovir in the ED which she tolerated well. Sent rest of course to pharmacy. Educated patient that she may continue to finish out her course  of ABX to continue treating possible cellulitis associated with shingles. Physical exam today is not concerning for spreading/worsening erythema. Recommended following up with PCP. Will provide patient with a couple more doses of Percocet.  Patient verbalized understanding of plan. I have reviewed the patients home medicines and have made adjustments as needed Patient afebrile with stable vitals. Provided with return precautions. Discharged in good condition.  Ddx: these are considered less likely due to history of present illness and physical exam - irritant contact dermatitis: physical exam unlikely -DRESS:  patient afebrile, stable vitals, no infectious symptoms -atopic dermatitis: rash diffuse, not confined to flexural areas -SJS/TEN: negative Nikolsky sign -[place] Spotted Fever/Lyme Disease: no hx tick bite or fever   Social Determinants of Health:  none         Final Clinical Impression(s) / ED Diagnoses Final diagnoses:  Herpes zoster without complication    Rx / DC Orders ED Discharge Orders          Ordered    acyclovir (ZOVIRAX) 400 MG tablet  5 times daily        12/18/22 0734    oxyCODONE-acetaminophen (PERCOCET/ROXICET) 5-325 MG tablet  Every 8 hours PRN        12/18/22 0737              Dorthy Cooler, PA-C 12/18/22 0743    Rexford Maus, DO 12/18/22 1110

## 2023-01-15 ENCOUNTER — Ambulatory Visit (HOSPITAL_COMMUNITY)
Admission: RE | Admit: 2023-01-15 | Discharge: 2023-01-15 | Disposition: A | Discharge status: Home / Self Care | Payer: 59 | Source: Ambulatory Visit | Attending: Emergency Medicine | Admitting: Emergency Medicine

## 2023-01-15 ENCOUNTER — Encounter (HOSPITAL_COMMUNITY): Payer: Self-pay

## 2023-01-15 VITALS — BP 111/77 | HR 68 | Temp 98.0°F | Resp 16

## 2023-01-15 DIAGNOSIS — J209 Acute bronchitis, unspecified: Secondary | ICD-10-CM

## 2023-01-15 DIAGNOSIS — R052 Subacute cough: Secondary | ICD-10-CM

## 2023-01-15 MED ORDER — FLUTICASONE PROPIONATE 50 MCG/ACT NA SUSP: 1.0000 | Freq: Two times a day (BID) | NASAL | 2 refills | Status: AC

## 2023-01-15 MED ORDER — IBUPROFEN 600 MG PO TABS: 600.0000 mg | ORAL_TABLET | Freq: Two times a day (BID) | ORAL | Status: AC | PRN

## 2023-01-15 MED ORDER — AMOXICILLIN-POT CLAVULANATE 875-125 MG PO TABS: 1.0000 | ORAL_TABLET | Freq: Two times a day (BID) | ORAL | 0 refills | Status: AC

## 2023-01-15 MED ORDER — PREDNISONE 10 MG PO TABS: ORAL_TABLET | ORAL | 0 refills | Status: AC

## 2023-01-15 MED ORDER — PROMETHAZINE-DM 6.25-15 MG/5ML PO SYRP: 5.0000 mL | ORAL_SOLUTION | Freq: Four times a day (QID) | ORAL | 0 refills | Status: AC | PRN

## 2023-01-15 NOTE — ED Triage Notes (Signed)
 Pt reports productive cough. Patient states sinus pressure and pain x 1 week. Patient has been treating her symptoms with Mucinex.

## 2023-01-15 NOTE — Discharge Instructions (Signed)
 Take medications as ordered.  Salt water gargles Motrin 600mg  or Tylenol 1000mg  three times a day for generalized body aches Ensure hydration Consider adding on Zinc and Vit C for immunity

## 2023-02-20 ENCOUNTER — Ambulatory Visit: Payer: 59 | Admitting: Physician Assistant

## 2023-02-28 ENCOUNTER — Encounter: Payer: Self-pay | Admitting: Physician Assistant

## 2023-02-28 ENCOUNTER — Ambulatory Visit: Payer: 59 | Admitting: Physician Assistant

## 2023-02-28 ENCOUNTER — Other Ambulatory Visit (INDEPENDENT_AMBULATORY_CARE_PROVIDER_SITE_OTHER): Payer: Self-pay

## 2023-02-28 DIAGNOSIS — M5442 Lumbago with sciatica, left side: Secondary | ICD-10-CM

## 2023-02-28 DIAGNOSIS — G8929 Other chronic pain: Secondary | ICD-10-CM | POA: Diagnosis not present

## 2023-02-28 MED ORDER — MELOXICAM 7.5 MG PO TABS: 7.5000 mg | ORAL_TABLET | Freq: Every day | ORAL | 0 refills | Status: AC

## 2023-02-28 NOTE — Progress Notes (Signed)
 Office Visit Note   Patient: Mikayla Wiley           Date of Birth: 1970-12-17           MRN: [id number] Visit Date: 02/28/2023              Requested by: [facility] [address] [place],  [place] [address] PCP: [facility]   Assessment & Plan: Visit Diagnoses:  1. Chronic left-sided low back pain with left-sided sciatica     Plan: Mikayla Wiley is a very pleasant 53 year old woman who has a long history of low back pain that originally just went down her left leg.  Unfortunately she did try anti-inflammatories which helped for a while as well as activity modification now she is having symptoms going down both legs.  Denies any loss of bowel or bladder control.  She cannot do steroids because they had an adverse effect on her.  Because this has been going on over a year she has tried alternative treatment including exercise and activity modification and anti-inflammatories I recommend an MRI she may be candidate for ESI.  Cannot completely rule out a hip origin however I think this is more likely given her symptoms based her lower back  Follow-Up Instructions: No follow-ups on file.   Orders:  Orders Placed This Encounter  Procedures   XR Lumbar Spine 2-3 Views   No orders of the defined types were placed in this encounter.     Procedures: No procedures performed   Clinical Data: No additional findings.   Subjective: Chief Complaint  Patient presents with   Lower Back - Pain    HPI patient is a pleasant 53 year old woman who I saw about a year ago for low back pain that radiated down her left side.  Thoughts were also that she might have trochanteric bursitis.  She did take anti-inflammatories comes in today complaining of low back pain going down the left side no new injury.  Pain is noted when she is laying down and sleeping and when switching positions denies any paresthesias or weakness  Review of Systems   All other systems reviewed and are negative.    Objective: Vital Signs: LMP 02/10/2021   Physical Exam Constitutional:      Appearance: Normal appearance.  Pulmonary:     Effort: Pulmonary effort is normal.  Skin:    General: Skin is warm and dry.  Neurological:     General: No focal deficit present.     Mental Status: She is alert and oriented to person, place, and time.  Psychiatric:        Mood and Affect: Mood normal.        Behavior: Behavior normal.     Ortho Exam She rises and sit slowly..  She has tenderness over the left posterior buttock and right posterior buttock right side does radiate a little bit into the hip and groin but no pain with range of motion.  Negative straight leg raise bilaterally she has good dorsiflexion plantarflexion extension flexion of her legs.  She is moderately tender over the trochanteric bursa but also over the posterior left buttock Specialty Comments:  No specialty comments available.  Imaging: No results found.   PMFS History: Patient Active Problem List   Diagnosis Date Noted   Rupture of anterior cruciate ligament of right knee    Peripheral tear of lateral meniscus of right knee as current injury  S/P cervical spinal fusion 01/31/2014   Past Medical History:  Diagnosis Date   COVID-19 12/2018   Headache    migrains    Family History  Problem Relation Age of Onset   Healthy Mother    Healthy Father     Past Surgical History:  Procedure Laterality Date   ANTERIOR CERVICAL DECOMP/DISCECTOMY FUSION N/A 01/31/2014   Procedure: C5-6 Anterior Cervical Discectomy and Fusion;  Surgeon: Eldred Manges, MD;  Location: [facility] OR;  Service: Orthopedics;  Laterality: N/A;   ANTERIOR CRUCIATE LIGAMENT REPAIR Right 02/20/2020   Procedure: RIGHT KNEE ANTERIOR CRUCIATE LIGAMENT (ACL) RECONSTRUCTION HAMSTRING AUTOGRAFT;  Surgeon: Cammy Copa, MD;  Location: [facility] OR;  Service: Orthopedics;  Laterality: Right;   CESAREAN SECTION      KNEE ARTHROSCOPY Right 12/91   Social History   Occupational History   Not on file  Tobacco Use   Smoking status: Never   Smokeless tobacco: Never  Vaping Use   Vaping status: Never Used  Substance and Sexual Activity   Alcohol use: Yes    Comment: socially   Drug use: No   Sexual activity: Not on file

## 2023-03-01 ENCOUNTER — Ambulatory Visit: Payer: 59 | Admitting: Physician Assistant

## 2023-03-12 NOTE — ED Provider Notes (Signed)
 [facility]    CSN: [id number] Arrival date & time: 01/15/23  1021      History   Chief Complaint Chief Complaint  Patient presents with   Cough    Major chest pain when I cough. Pain is unbearable. Sinus head cold that i think my be flu related. Stuffy nose, congestion,  coughing up flem, running nose. - Entered by patient    HPI Mikayla Wiley is a 53 y.o. female.   Patient has been sick x 2 weeks.  Over the last week cough has become  productive and sinus pressure and pain have worsened. Patient has been treating her symptoms with Mucinex.  Cough becomes "spastic" in nature.  She does have chest tightness.  No history of lung disease. No fever or GI symptoms    The history is provided by the patient.  Cough   Past Medical History:  Diagnosis Date   COVID-19 12/2018   Headache    migrains    Patient Active Problem List   Diagnosis Date Noted   Rupture of anterior cruciate ligament of right knee    Peripheral tear of lateral meniscus of right knee as current injury    S/P cervical spinal fusion 01/31/2014    Past Surgical History:  Procedure Laterality Date   ANTERIOR CERVICAL DECOMP/DISCECTOMY FUSION N/A 01/31/2014   Procedure: C5-6 Anterior Cervical Discectomy and Fusion;  Surgeon: Eldred Manges, MD;  Location: [facility] OR;  Service: Orthopedics;  Laterality: N/A;   ANTERIOR CRUCIATE LIGAMENT REPAIR Right 02/20/2020   Procedure: RIGHT KNEE ANTERIOR CRUCIATE LIGAMENT (ACL) RECONSTRUCTION HAMSTRING AUTOGRAFT;  Surgeon: Cammy Copa, MD;  Location: [facility] OR;  Service: Orthopedics;  Laterality: Right;   CESAREAN SECTION     KNEE ARTHROSCOPY Right 12/91    OB History   No obstetric history on file.      Home Medications    Prior to Admission medications   Medication Sig Start Date End Date Taking? Authorizing Provider  amoxicillin-clavulanate (AUGMENTIN) 875-125 MG tablet Take 1 tablet by mouth every 12 (twelve) hours. 01/15/23  Yes Selma Rodelo, Linde Gillis,  NP  fluticasone (FLONASE) 50 MCG/ACT nasal spray Place 1 spray into both nostrils in the morning and at bedtime. 01/15/23  Yes Aleksei Goodlin, Linde Gillis, NP  promethazine-dextromethorphan (PROMETHAZINE-DM) 6.25-15 MG/5ML syrup Take 5 mLs by mouth 4 (four) times daily as needed for cough. 01/15/23  Yes Verlie Liotta, Linde Gillis, NP  albuterol (VENTOLIN HFA) 108 (90 Base) MCG/ACT inhaler Inhale 1-2 puffs into the lungs every 6 (six) hours as needed for wheezing or shortness of breath. 02/01/20   Rushie Chestnut, PA-C  ibuprofen (ADVIL) 600 MG tablet Take 1 tablet (600 mg total) by mouth 2 (two) times daily as needed. 01/15/23   Coren Sagan, Linde Gillis, NP  meloxicam (MOBIC) 7.5 MG tablet Take 1 tablet (7.5 mg total) by mouth daily. 02/28/23   Persons, [place], PA    Family History Family History  Problem Relation Age of Onset   Healthy Mother    Healthy Father     Social History Social History   Tobacco Use   Smoking status: Never   Smokeless tobacco: Never  Vaping Use   Vaping status: Never Used  Substance Use Topics   Alcohol use: Yes    Comment: socially   Drug use: No     Allergies   Patient has no known allergies.   Review of Systems Review of Systems  HENT:  Positive for congestion.   Respiratory:  Positive for cough and chest tightness.      Physical Exam Triage Vital Signs ED Triage Vitals [01/15/23 1047]  Encounter Vitals Group     BP 111/77     Systolic BP Percentile      Diastolic BP Percentile      Pulse Rate 68     Resp 16     Temp 98 F (36.7 C)     Temp Source Oral     SpO2 98 %     Weight      Height      Head Circumference      Peak Flow      Pain Score      Pain Loc      Pain Education      Exclude from Growth Chart    No data found.  Updated Vital Signs BP 111/77 (BP Location: Left Arm)   Pulse 68   Temp 98 F (36.7 C) (Oral)   Resp 16   LMP 02/10/2021   SpO2 98%   Visual Acuity Right Eye Distance:   Left Eye Distance:   Bilateral Distance:     Right Eye Near:   Left Eye Near:    Bilateral Near:     Physical Exam Vitals and nursing note reviewed.  HENT:     Nose: Congestion present.     Comments: Posterior nasal drainage noted    Mouth/Throat:     Pharynx: Posterior oropharyngeal erythema present.  Eyes:     Conjunctiva/sclera: Conjunctivae normal.  Cardiovascular:     Rate and Rhythm: Normal rate and regular rhythm.     Heart sounds: Normal heart sounds.  Pulmonary:     Breath sounds: Wheezing present.     Comments: Wheezing noted in upper lobes Chest:     Chest wall: Tenderness present.  Neurological:     Mental Status: She is alert.      UC Treatments / Results  Labs (all labs ordered are listed, but only abnormal results are displayed) Labs Reviewed - No data to display  EKG   Radiology No results found.  Procedures Procedures (including critical care time)  Medications Ordered in UC Medications - No data to display  Initial Impression / Assessment and Plan / UC Course  I have reviewed the triage vital signs and the nursing notes.  Pertinent labs & imaging results that were available during my care of the patient were reviewed by me and considered in my medical decision making (see chart for details).   Patient presents today for worsening cough that is spastic in nature.  She did report a viral illness prior.  She is now reporting some chest tightness and mild increase in exertion dyspnea.  On auscultation wheezing noted bilaterally, no rhonchi or rales.  She states the cough is productive but was noted to be dry in clinic.  This could be related to the usage of mucinex??Marland Kitchen  Symptoms are consistent with acute bronchitits we will treat with abx, prednisone, and cough syrup at this time.  She is encouraged to f/u with pcp if symptoms do not resolve over the next week.  Continue mucinex at home, tylenol or ibuprofen for headaches  Final Clinical Impressions(s) / UC Diagnoses   Final diagnoses:   Acute bronchitis, unspecified organism  Subacute cough     Discharge Instructions      Take medications as ordered.  Salt water gargles Motrin 600mg  or Tylenol 1000mg  three times a day for generalized body aches  Ensure hydration Consider adding on Zinc and Vit C for immunity     ED Prescriptions     Medication Sig Dispense Auth. Provider   ibuprofen (ADVIL) 600 MG tablet Take 1 tablet (600 mg total) by mouth 2 (two) times daily as needed. -- Nelda Marseille, NP   amoxicillin-clavulanate (AUGMENTIN) 875-125 MG tablet Take 1 tablet by mouth every 12 (twelve) hours. 14 tablet Jaleeah Slight, Linde Gillis, NP   predniSONE (DELTASONE) 10 MG tablet Take 2 tablets (20 mg total) by mouth daily with breakfast for 3 days, THEN 1 tablet (10 mg total) daily with breakfast for 3 days. 9 tablet Yassmine Tamm, Linde Gillis, NP   promethazine-dextromethorphan (PROMETHAZINE-DM) 6.25-15 MG/5ML syrup Take 5 mLs by mouth 4 (four) times daily as needed for cough. 200 mL Franki Stemen M, NP   fluticasone (FLONASE) 50 MCG/ACT nasal spray Place 1 spray into both nostrils in the morning and at bedtime. 18.2 mL Troyce Febo, Linde Gillis, NP      PDMP not reviewed this encounter.   Nelda Marseille, NP 03/12/23 1425

## 2023-03-18 ENCOUNTER — Other Ambulatory Visit: Payer: 59

## 2023-03-26 ENCOUNTER — Ambulatory Visit
Admission: RE | Admit: 2023-03-26 | Discharge: 2023-03-26 | Disposition: A | Discharge status: Home / Self Care | Source: Ambulatory Visit | Attending: Physician Assistant

## 2023-03-26 DIAGNOSIS — G8929 Other chronic pain: Secondary | ICD-10-CM

## 2023-03-28 ENCOUNTER — Other Ambulatory Visit: Payer: Self-pay | Admitting: Physician Assistant

## 2023-04-06 ENCOUNTER — Ambulatory Visit: Admitting: Physician Assistant

## 2023-04-06 ENCOUNTER — Encounter: Payer: Self-pay | Admitting: Physician Assistant

## 2023-04-06 DIAGNOSIS — M545 Low back pain, unspecified: Secondary | ICD-10-CM

## 2023-04-06 DIAGNOSIS — G8929 Other chronic pain: Secondary | ICD-10-CM | POA: Diagnosis not present

## 2023-04-06 MED ORDER — MELOXICAM 15 MG PO TABS: 15.0000 mg | ORAL_TABLET | Freq: Every day | ORAL | 0 refills | Status: AC

## 2023-04-06 NOTE — Progress Notes (Signed)
 Office Visit Note   Patient: Mikayla Wiley           Date of Birth: 1970-12-10           MRN: [id number] Visit Date: 04/06/2023              Requested by: [facility] [address] [place],  [place] [address] PCP: [facility]  Chief Complaint  Patient presents with   Lower Back - Pain   Results    Patient in today for MRI results of lower back       HPI: Mikayla Wiley is a pleasant 53 year old woman with seems for his low back pain that radiates sometimes down her left but today down her right buttock.  She did have a flareup recently.  She took meloxicam and ibuprofen seem to help it.  She did have an MRI and is here to review this today  Assessment & Plan: Visit Diagnoses: Low back pain  Plan: I reviewed the MRI with our spine team.  Findings were pretty benign.  She does seem to get pain in the paravertebral region but is down the lateral side of her hip but no hip pain in the hip itself.  I like her to try 1 more time and physical therapy.  Have also given her 15 mg meloxicam's.  Asked her not to combine this with ibuprofen.  I will have her make a follow-up appointment with Megan in about 6 weeks  Follow-Up Instructions: Return in about 6 weeks (around 05/18/2023).   Ortho Exam  Patient is alert, oriented, no adenopathy, well-dressed, normal affect, normal respiratory effort. Examination of her lower back she has no tenderness to palpation she has excellent strength no sensation issues no changes since previous exam no tenderness in her groin  Imaging: No results found. No images are attached to the encounter.  Labs: No results found for: "HGBA1C", "ESRSEDRATE", "CRP", "LABURIC", "REPTSTATUS", "GRAMSTAIN", "CULT", "LABORGA"   Lab Results  Component Value Date   ALBUMIN 4.3 01/22/2021   ALBUMIN 3.8 01/03/2019   ALBUMIN 3.6 02/01/2014    No results found for: "MG" No results found for: "VD25OH"  No results  found for: "PREALBUMIN"    Latest Ref Rng & Units 01/22/2021   10:00 AM 02/17/2020    8:28 AM 01/03/2019    6:53 PM  CBC EXTENDED  WBC 4.0 - 10.5 K/uL 5.1  5.1  2.7   RBC 3.87 - 5.11 MIL/uL 4.36  4.31  4.48   Hemoglobin 12.0 - 15.0 g/dL 69.6  29.5  28.4   HCT 36.0 - 46.0 % 39.7  40.1  41.6   Platelets 150 - 400 K/uL 243  225  180   NEUT# 1.7 - 7.7 K/uL 3.2     Lymph# 0.7 - 4.0 K/uL 1.6        There is no height or weight on file to calculate BMI.  Orders:  Orders Placed This Encounter  Procedures   Ambulatory referral to Physical Therapy   No orders of the defined types were placed in this encounter.    Procedures: No procedures performed  Clinical Data: No additional findings.  ROS:  All other systems negative, except as noted in the HPI. Review of Systems  Objective: Vital Signs: LMP 02/10/2021   Specialty Comments:  No specialty comments available.  PMFS History: Patient Active Problem List   Diagnosis Date Noted   Rupture of anterior cruciate ligament  of right knee    Peripheral tear of lateral meniscus of right knee as current injury    S/P cervical spinal fusion 01/31/2014   Past Medical History:  Diagnosis Date   COVID-19 12/2018   Headache    migrains    Family History  Problem Relation Age of Onset   Healthy Mother    Healthy Father     Past Surgical History:  Procedure Laterality Date   ANTERIOR CERVICAL DECOMP/DISCECTOMY FUSION N/A 01/31/2014   Procedure: C5-6 Anterior Cervical Discectomy and Fusion;  Surgeon: Eldred Manges, MD;  Location: [facility];  Service: Orthopedics;  Laterality: N/A;   ANTERIOR CRUCIATE LIGAMENT REPAIR Right 02/20/2020   Procedure: RIGHT KNEE ANTERIOR CRUCIATE LIGAMENT (ACL) RECONSTRUCTION HAMSTRING AUTOGRAFT;  Surgeon: Cammy Copa, MD;  Location: [facility];  Service: Orthopedics;  Laterality: Right;   CESAREAN SECTION     KNEE ARTHROSCOPY Right 12/91   Social History   Occupational History   Not on file  Tobacco  Use   Smoking status: Never   Smokeless tobacco: Never  Vaping Use   Vaping status: Never Used  Substance and Sexual Activity   Alcohol use: Yes    Comment: socially   Drug use: No   Sexual activity: Not on file

## 2023-04-28 ENCOUNTER — Ambulatory Visit: Admitting: Physical Therapy

## 2023-05-18 ENCOUNTER — Encounter: Admitting: Physical Medicine and Rehabilitation

## 2023-07-19 DIAGNOSIS — M722 Plantar fascial fibromatosis: Secondary | ICD-10-CM | POA: Diagnosis not present

## 2023-07-19 DIAGNOSIS — M205X2 Other deformities of toe(s) (acquired), left foot: Secondary | ICD-10-CM | POA: Diagnosis not present

## 2023-07-19 DIAGNOSIS — M7671 Peroneal tendinitis, right leg: Secondary | ICD-10-CM | POA: Diagnosis not present

## 2023-08-07 ENCOUNTER — Encounter (HOSPITAL_COMMUNITY): Payer: Self-pay | Admitting: Emergency Medicine

## 2023-08-07 ENCOUNTER — Emergency Department (HOSPITAL_COMMUNITY)
Admission: EM | Admit: 2023-08-07 | Discharge: 2023-08-08 | Disposition: A | Discharge status: Home / Self Care | Payer: Self-pay | Attending: Emergency Medicine | Admitting: Emergency Medicine

## 2023-08-07 ENCOUNTER — Other Ambulatory Visit: Payer: Self-pay

## 2023-08-07 DIAGNOSIS — R051 Acute cough: Secondary | ICD-10-CM | POA: Diagnosis not present

## 2023-08-07 DIAGNOSIS — E876 Hypokalemia: Secondary | ICD-10-CM | POA: Insufficient documentation

## 2023-08-07 DIAGNOSIS — R062 Wheezing: Secondary | ICD-10-CM | POA: Diagnosis not present

## 2023-08-07 DIAGNOSIS — R0602 Shortness of breath: Secondary | ICD-10-CM | POA: Diagnosis not present

## 2023-08-07 MED ORDER — IPRATROPIUM-ALBUTEROL 0.5-2.5 (3) MG/3ML IN SOLN
3.0000 mL | Freq: Once | RESPIRATORY_TRACT | Status: AC
  Administered 2023-08-08: 3 mL via RESPIRATORY_TRACT
  Filled 2023-08-07: qty 3

## 2023-08-07 NOTE — ED Provider Notes (Signed)
  [place] [facility]ERGENCY DEPARTMENT AT [facility] Provider Note   CSN: [id number] Arrival date & time: 08/07/23  2347    Patient presents with: Shortness of Breath   Mikayla Wiley is a 53 y.o. female here for 1 week of SOB.  {Add pertinent medical, surgical, social history, OB history to HPI:32947} HPI     Prior to Admission medications   Medication Sig Start Date End Date Taking? Authorizing Provider  albuterol  (VENTOLIN  HFA) 108 (90 Base) MCG/ACT inhaler Inhale 1-2 puffs into the lungs every 6 (six) hours as needed for wheezing or shortness of breath. 02/01/20   Tonette Lauraine HERO, PA-C  amoxicillin -clavulanate (AUGMENTIN ) 875-125 MG tablet Take 1 tablet by mouth every 12 (twelve) hours. 01/15/23   Blitch, Marval HERO, NP  fluticasone  (FLONASE ) 50 MCG/ACT nasal spray Place 1 spray into both nostrils in the morning and at bedtime. 01/15/23   Blitch, Marval HERO, NP  ibuprofen  (ADVIL ) 600 MG tablet Take 1 tablet (600 mg total) by mouth 2 (two) times daily as needed. 01/15/23   Blitch, Marval HERO, NP  meloxicam  (MOBIC ) 15 MG tablet Take 1 tablet (15 mg total) by mouth daily. 04/06/23   Persons, Ronal Dragon, PA  meloxicam  (MOBIC ) 7.5 MG tablet Take 1 tablet (7.5 mg total) by mouth daily. 02/28/23   Persons, Ronal Dragon, PA  promethazine -dextromethorphan (PROMETHAZINE -DM) 6.25-15 MG/5ML syrup Take 5 mLs by mouth 4 (four) times daily as needed for cough. 01/15/23   Blitch, Marval HERO, NP    Allergies: Patient has no known allergies.    Review of Systems  Updated Vital Signs BP (!) 126/109 (BP Location: Right Arm)   Pulse 72   Temp 98.6 F (37 C) (Oral)   Resp 20   LMP 02/10/2021   SpO2 100%   Physical Exam  (all labs ordered are listed, but only abnormal results are displayed) Labs Reviewed - No data to display  EKG: None  Radiology: No results found.  {Document cardiac monitor, telemetry assessment procedure when appropriate:32947} Procedures   Medications Ordered in the ED   ipratropium-albuterol  (DUONEB) 0.5-2.5 (3) MG/3ML nebulizer solution 3 mL (has no administration in time range)      {Click here for ABCD2, HEART and other calculators REFRESH Note before signing:1}                              Medical Decision Making Amount and/or Complexity of Data Reviewed Labs: ordered. Radiology: ordered.  Risk Prescription drug management.   ***  {Document critical care time when appropriate  Document review of labs and clinical decision tools ie CHADS2VASC2, etc  Document your independent review of radiology images and any outside records  Document your discussion with family members, caretakers and with consultants  Document social determinants of health affecting pt's care  Document your decision making why or why not admission, treatments were needed:32947:::1}   Final diagnoses:  None    ED Discharge Orders     None

## 2023-08-07 NOTE — ED Triage Notes (Signed)
 Pt having SOB for about a week. Pt using her inhaler for her asthma a little more.

## 2023-08-08 ENCOUNTER — Emergency Department (HOSPITAL_COMMUNITY)

## 2023-08-08 DIAGNOSIS — R0602 Shortness of breath: Secondary | ICD-10-CM | POA: Diagnosis not present

## 2023-08-08 LAB — BASIC METABOLIC PANEL WITH GFR
Anion gap: 10 (ref 5–15)
BUN: 16 mg/dL (ref 6–20)
CO2: 20 mmol/L — ABNORMAL LOW (ref 22–32)
Calcium: 8.6 mg/dL — ABNORMAL LOW (ref 8.9–10.3)
Chloride: 112 mmol/L — ABNORMAL HIGH (ref 98–111)
Creatinine, Ser: 1.1 mg/dL — ABNORMAL HIGH (ref 0.44–1.00)
GFR, Estimated: 60 mL/min — ABNORMAL LOW (ref >60)
Glucose, Bld: 123 mg/dL — ABNORMAL HIGH (ref 70–99)
Potassium: 3.2 mmol/L — ABNORMAL LOW (ref 3.5–5.1)
Sodium: 142 mmol/L (ref 135–145)

## 2023-08-08 LAB — CBC WITH DIFFERENTIAL/PLATELET
Abs Immature Granulocytes: 0.03 K/uL (ref 0.00–0.07)
Basophils Absolute: 0 K/uL (ref 0.0–0.1)
Basophils Relative: 0 %
Eosinophils Absolute: 0 K/uL (ref 0.0–0.5)
Eosinophils Relative: 0 %
HCT: 41 % (ref 36.0–46.0)
Hemoglobin: 13 g/dL (ref 12.0–15.0)
Immature Granulocytes: 0 %
Lymphocytes Relative: 34 %
Lymphs Abs: 2.4 K/uL (ref 0.7–4.0)
MCH: 29.6 pg (ref 26.0–34.0)
MCHC: 31.7 g/dL (ref 30.0–36.0)
MCV: 93.4 fL (ref 80.0–100.0)
Monocytes Absolute: 0.4 K/uL (ref 0.1–1.0)
Monocytes Relative: 5 %
Neutro Abs: 4.3 K/uL (ref 1.7–7.7)
Neutrophils Relative %: 61 %
Platelets: 227 K/uL (ref 150–400)
RBC: 4.39 MIL/uL (ref 3.87–5.11)
RDW: 13.4 % (ref 11.5–15.5)
WBC: 7.2 K/uL (ref 4.0–10.5)
nRBC: 0 % (ref 0.0–0.2)

## 2023-08-08 LAB — RESP PANEL BY RT-PCR (RSV, FLU A&B, COVID)  RVPGX2
Influenza A by PCR: NEGATIVE
Influenza B by PCR: NEGATIVE
Resp Syncytial Virus by PCR: NEGATIVE
SARS Coronavirus 2 by RT PCR: NEGATIVE

## 2023-08-08 LAB — TROPONIN I (HIGH SENSITIVITY)
Troponin I (High Sensitivity): 3 ng/L (ref <18)
Troponin I (High Sensitivity): 2 ng/L (ref <18)

## 2023-08-08 LAB — BRAIN NATRIURETIC PEPTIDE: B Natriuretic Peptide: 16.9 pg/mL (ref 0.0–100.0)

## 2023-08-08 LAB — D-DIMER, QUANTITATIVE: D-Dimer, Quant: <0.27 ug{FEU}/mL (ref 0.00–0.50)

## 2023-08-08 MED ORDER — PREDNISONE 10 MG (21) PO TBPK
ORAL_TABLET | Freq: Every day | ORAL | 0 refills | Status: AC
Start: 2023-08-08 — End: ?

## 2023-08-08 MED ORDER — AZITHROMYCIN 250 MG PO TABS: 250.0000 mg | ORAL_TABLET | Freq: Every day | ORAL | 0 refills | Status: AC

## 2023-08-08 NOTE — ED Notes (Signed)
Pt discharged. Pt given discharge papers and papers explained. Pt in NAD at this time

## 2023-08-08 NOTE — Discharge Instructions (Addendum)
 It was a pleasure taking care of you today.   I have started you antibiotics and steroids. Use you inhaler every 4 hours as needed.  Return for new or worsening symptoms

## 2023-08-14 DIAGNOSIS — M7671 Peroneal tendinitis, right leg: Secondary | ICD-10-CM | POA: Diagnosis not present

## 2023-08-14 DIAGNOSIS — Z1389 Encounter for screening for other disorder: Secondary | ICD-10-CM | POA: Diagnosis not present

## 2023-08-14 DIAGNOSIS — R262 Difficulty in walking, not elsewhere classified: Secondary | ICD-10-CM | POA: Diagnosis not present

## 2023-08-14 DIAGNOSIS — Z23 Encounter for immunization: Secondary | ICD-10-CM | POA: Diagnosis not present

## 2023-08-14 DIAGNOSIS — Z0000 Encounter for general adult medical examination without abnormal findings: Secondary | ICD-10-CM | POA: Diagnosis not present

## 2023-08-14 DIAGNOSIS — M722 Plantar fascial fibromatosis: Secondary | ICD-10-CM | POA: Diagnosis not present

## 2023-08-14 DIAGNOSIS — J4540 Moderate persistent asthma, uncomplicated: Secondary | ICD-10-CM | POA: Diagnosis not present

## 2023-11-06 ENCOUNTER — Encounter: Payer: Self-pay | Admitting: Radiology
